# Patient Record
Sex: Male | Born: 1965 | Race: Black or African American | Hispanic: No | Marital: Single | Smoking: Never smoker
Health system: Southern US, Community
[De-identification: ages and names within clinical notes are randomized; demographics above are authoritative.]

## PROBLEM LIST (undated history)

## (undated) DIAGNOSIS — N529 Male erectile dysfunction, unspecified: Secondary | ICD-10-CM

## (undated) DIAGNOSIS — A6000 Herpesviral infection of urogenital system, unspecified: Secondary | ICD-10-CM

## (undated) DIAGNOSIS — E785 Hyperlipidemia, unspecified: Secondary | ICD-10-CM

## (undated) DIAGNOSIS — T7840XA Allergy, unspecified, initial encounter: Secondary | ICD-10-CM

## (undated) DIAGNOSIS — K219 Gastro-esophageal reflux disease without esophagitis: Secondary | ICD-10-CM

## (undated) DIAGNOSIS — G4730 Sleep apnea, unspecified: Secondary | ICD-10-CM

## (undated) DIAGNOSIS — R0789 Other chest pain: Secondary | ICD-10-CM

## (undated) DIAGNOSIS — K760 Fatty (change of) liver, not elsewhere classified: Secondary | ICD-10-CM

## (undated) HISTORY — PX: SMALL INTESTINE SURGERY: SHX150

## (undated) HISTORY — DX: Sleep apnea, unspecified: G47.30

## (undated) HISTORY — DX: Hyperlipidemia, unspecified: E78.5

## (undated) HISTORY — DX: Male erectile dysfunction, unspecified: N52.9

## (undated) HISTORY — DX: Herpesviral infection of urogenital system, unspecified: A60.00

## (undated) HISTORY — DX: Gastro-esophageal reflux disease without esophagitis: K21.9

## (undated) HISTORY — DX: Other chest pain: R07.89

## (undated) HISTORY — DX: Allergy, unspecified, initial encounter: T78.40XA

## (undated) HISTORY — DX: Fatty (change of) liver, not elsewhere classified: K76.0

---

## 1998-04-26 ENCOUNTER — Ambulatory Visit (HOSPITAL_COMMUNITY): Admission: RE | Admit: 1998-04-26 | Discharge: 1998-04-26 | Payer: Self-pay | Admitting: Pulmonary Disease

## 2008-02-24 ENCOUNTER — Ambulatory Visit: Payer: Self-pay | Admitting: Internal Medicine

## 2008-02-24 DIAGNOSIS — F528 Other sexual dysfunction not due to a substance or known physiological condition: Secondary | ICD-10-CM

## 2008-02-24 DIAGNOSIS — E785 Hyperlipidemia, unspecified: Secondary | ICD-10-CM | POA: Insufficient documentation

## 2008-02-24 DIAGNOSIS — E669 Obesity, unspecified: Secondary | ICD-10-CM

## 2008-02-24 HISTORY — DX: Other sexual dysfunction not due to a substance or known physiological condition: F52.8

## 2008-02-24 HISTORY — DX: Obesity, unspecified: E66.9

## 2008-02-24 HISTORY — DX: Hyperlipidemia, unspecified: E78.5

## 2008-02-24 LAB — CONVERTED CEMR LAB
ALT: 41 units/L (ref 0–53)
Albumin: 4.7 g/dL (ref 3.5–5.2)
BUN: 15 mg/dL (ref 6–23)
Basophils Absolute: 0 10*3/uL (ref 0.0–0.1)
Basophils Relative: 0.5 % (ref 0.0–3.0)
CO2: 30 meq/L (ref 19–32)
Calcium: 10.1 mg/dL (ref 8.4–10.5)
Creatinine, Ser: 1.2 mg/dL (ref 0.4–1.5)
Direct LDL: 146.5 mg/dL
Eosinophils Absolute: 0.1 10*3/uL (ref 0.0–0.7)
Eosinophils Relative: 1.5 % (ref 0.0–5.0)
Hemoglobin: 16.1 g/dL (ref 13.0–17.0)
Hgb A1c MFr Bld: 5.4 % (ref 4.6–6.0)
MCHC: 35.2 g/dL (ref 30.0–36.0)
MCV: 90.3 fL (ref 78.0–100.0)
Neutro Abs: 4.6 10*3/uL (ref 1.4–7.7)
RBC: 5.06 M/uL (ref 4.22–5.81)
TSH: 1.01 microintl units/mL (ref 0.35–5.50)
Testosterone-% Free: 2.5 % (ref 1.6–2.9)
Testosterone: 221.47 ng/dL — ABNORMAL LOW (ref 350–890)
Total Bilirubin: 1 mg/dL (ref 0.3–1.2)
WBC: 7.1 10*3/uL (ref 4.5–10.5)

## 2008-02-25 ENCOUNTER — Telehealth: Payer: Self-pay | Admitting: Internal Medicine

## 2008-02-25 DIAGNOSIS — E291 Testicular hypofunction: Secondary | ICD-10-CM

## 2008-02-25 HISTORY — DX: Testicular hypofunction: E29.1

## 2008-03-04 ENCOUNTER — Ambulatory Visit: Payer: Self-pay | Admitting: Endocrinology

## 2008-03-04 LAB — CONVERTED CEMR LAB
FSH: 5.2 milliintl units/mL
LH: 2.4 milliintl units/mL
Prolactin: 14 ng/mL
Saturation Ratios: 15.2 % — ABNORMAL LOW (ref 20.0–50.0)
Transferrin: 253.2 mg/dL (ref 212.0–360.0)

## 2008-03-23 ENCOUNTER — Ambulatory Visit: Payer: Self-pay | Admitting: Internal Medicine

## 2008-03-23 DIAGNOSIS — E781 Pure hyperglyceridemia: Secondary | ICD-10-CM | POA: Insufficient documentation

## 2008-03-23 DIAGNOSIS — J309 Allergic rhinitis, unspecified: Secondary | ICD-10-CM

## 2008-03-23 HISTORY — DX: Allergic rhinitis, unspecified: J30.9

## 2008-04-20 ENCOUNTER — Ambulatory Visit: Payer: Self-pay | Admitting: Internal Medicine

## 2008-10-19 ENCOUNTER — Ambulatory Visit: Payer: Self-pay | Admitting: Internal Medicine

## 2009-03-24 ENCOUNTER — Encounter: Payer: Self-pay | Admitting: Internal Medicine

## 2009-03-24 LAB — CONVERTED CEMR LAB
ALT: 33 units/L (ref 0–53)
AST: 30 units/L (ref 0–37)
BUN: 12 mg/dL (ref 6–23)
Basophils Absolute: 0 10*3/uL (ref 0.0–0.1)
Basophils Relative: 1 % (ref 0–1)
Bilirubin, Direct: 0.1 mg/dL (ref 0.0–0.3)
CO2: 27 meq/L (ref 19–32)
Calcium: 9.8 mg/dL (ref 8.4–10.5)
Cholesterol: 218 mg/dL — ABNORMAL HIGH (ref 0–200)
Eosinophils Absolute: 0.4 10*3/uL (ref 0.0–0.7)
Eosinophils Relative: 6 % — ABNORMAL HIGH (ref 0–5)
Glucose, Bld: 90 mg/dL (ref 70–99)
HCT: 42.6 % (ref 39.0–52.0)
Hemoglobin: 14.6 g/dL (ref 13.0–17.0)
Indirect Bilirubin: 0.4 mg/dL (ref 0.0–0.9)
Leukocytes, UA: NEGATIVE
MCHC: 34.3 g/dL (ref 30.0–36.0)
MCV: 89.9 fL (ref 78.0–100.0)
Monocytes Absolute: 0.5 10*3/uL (ref 0.1–1.0)
Monocytes Relative: 8 % (ref 3–12)
Nitrite: NEGATIVE
RBC: 4.74 M/uL (ref 4.22–5.81)
RDW: 13.2 % (ref 11.5–15.5)
Sodium: 141 meq/L (ref 135–145)
Specific Gravity, Urine: 1.022 (ref 1.005–1.030)
TSH: 0.823 microintl units/mL (ref 0.350–4.500)
Total Bilirubin: 0.5 mg/dL (ref 0.3–1.2)
Total CHOL/HDL Ratio: 5.9
Urine Glucose: NEGATIVE mg/dL
pH: 6 (ref 5.0–8.0)

## 2009-04-01 ENCOUNTER — Ambulatory Visit: Payer: Self-pay | Admitting: Internal Medicine

## 2009-04-01 DIAGNOSIS — B079 Viral wart, unspecified: Secondary | ICD-10-CM

## 2009-04-01 HISTORY — DX: Viral wart, unspecified: B07.9

## 2009-10-07 ENCOUNTER — Ambulatory Visit: Payer: Self-pay | Admitting: Internal Medicine

## 2009-10-07 DIAGNOSIS — R1319 Other dysphagia: Secondary | ICD-10-CM

## 2009-10-07 HISTORY — DX: Other dysphagia: R13.19

## 2009-10-14 ENCOUNTER — Encounter: Admission: RE | Admit: 2009-10-14 | Discharge: 2009-10-14 | Payer: Self-pay | Admitting: Internal Medicine

## 2009-10-14 ENCOUNTER — Telehealth: Payer: Self-pay | Admitting: Internal Medicine

## 2009-10-14 ENCOUNTER — Encounter (INDEPENDENT_AMBULATORY_CARE_PROVIDER_SITE_OTHER): Payer: Self-pay | Admitting: *Deleted

## 2009-10-14 DIAGNOSIS — K222 Esophageal obstruction: Secondary | ICD-10-CM

## 2009-10-14 HISTORY — DX: Esophageal obstruction: K22.2

## 2009-10-17 ENCOUNTER — Encounter (INDEPENDENT_AMBULATORY_CARE_PROVIDER_SITE_OTHER): Payer: Self-pay | Admitting: *Deleted

## 2009-11-28 ENCOUNTER — Ambulatory Visit: Payer: Self-pay | Admitting: Gastroenterology

## 2009-11-28 ENCOUNTER — Encounter (INDEPENDENT_AMBULATORY_CARE_PROVIDER_SITE_OTHER): Payer: Self-pay | Admitting: *Deleted

## 2010-01-05 ENCOUNTER — Encounter: Payer: Self-pay | Admitting: Gastroenterology

## 2010-01-05 ENCOUNTER — Ambulatory Visit (HOSPITAL_COMMUNITY)
Admission: RE | Admit: 2010-01-05 | Discharge: 2010-01-05 | Payer: Self-pay | Source: Home / Self Care | Attending: Gastroenterology | Admitting: Gastroenterology

## 2010-01-10 ENCOUNTER — Telehealth: Payer: Self-pay | Admitting: Gastroenterology

## 2010-01-12 ENCOUNTER — Encounter: Payer: Self-pay | Admitting: Gastroenterology

## 2010-01-31 NOTE — Letter (Signed)
Summary: EGD Instructions  [name]  [address] [place], [place] [address]   Phone: [phone]  Fax: [phone]       Adrian Schmidt    1965/03/02    MRN: [id number]       Procedure Day /Date: 01/05/10 THURS     Arrival Time: 7 am     Procedure Time:8 am     Location of Procedure:                     [facility] ( Outpatient Registration)    PREPARATION FOR ENDOSCOPY   On 01/05/10  THE DAY OF THE PROCEDURE:  1.   No solid foods, milk or milk products are allowed after midnight the night before your procedure.  2.   Do not drink anything colored red or purple.  Avoid juices with pulp.  No orange juice.  3.  You may drink clear liquids until 4 am, which is 4 hours before your procedure.                                                                                                CLEAR LIQUIDS INCLUDE: Water Jello Ice Popsicles Tea (sugar ok, no milk/cream) Powdered fruit flavored drinks Coffee (sugar ok, no milk/cream) Gatorade Juice: apple, white grape, white cranberry  Lemonade Clear bullion, consomm, broth Carbonated beverages (any kind) Strained chicken noodle soup Hard Candy   MEDICATION INSTRUCTIONS  Unless otherwise instructed, you should take regular prescription medications with a small sip of water as early as possible the morning of your procedure.           OTHER INSTRUCTIONS  You will need a responsible adult at least 43 years of age to accompany you and drive you home.   This person must remain in the waiting room during your procedure.  Wear loose fitting clothing that is easily removed.  Leave jewelry and other valuables at home.  However, you may wish to bring a book to read or an iPod/MP3 player to listen to music as you wait for your procedure to start.  Remove all body piercing jewelry and leave at home.  Total time from sign-in until discharge is approximately 2-3 hours.  You should go home directly  after your procedure and rest.  You can resume normal activities the day after your procedure.  The day of your procedure you should not:   Drive   Make legal decisions   Operate machinery   Drink alcohol   Return to work  You will receive specific instructions about eating, activities and medications before you leave.    The above instructions have been reviewed and explained to me by   _______________________    I fully understand and can verbalize these instructions _____________________________ Date _________

## 2010-01-31 NOTE — Assessment & Plan Note (Signed)
Summary: 6 mo. f/u - jr   Vital Signs:  Patient profile:   45 year old male Height:      72 inches Weight:      238.25 pounds BMI:     32.43 O2 Sat:      98 % on Room air Temp:     98.1 degrees F oral Pulse rate:   65 / minute Pulse rhythm:   regular Resp:     18 per minute BP sitting:   104 / 72  (right arm) Cuff size:   large  Vitals Entered By: Glendell Docker CMA (October 07, 2009 2:43 PM)  O2 Flow:  Room air CC: 6 month follow up  Is Patient Diabetic? No Pain Assessment Patient in pain? no      Comments paient states that he wakes up in the middle of the night feeling like he wants to vomit, he is concerned with chronic snoring   Primary Care Provider:  Dondra Spry DO  CC:  6 month follow up .  History of Present Illness: 45 y/o AA male c/o intermittent nocturnal regurgitation 3 or 4 times over last 6 months he wakes up with sensation to vomit seems like symptoms assoc with eating late at night  no day time symptoms of nausea or vomiting  occ feels food gets transiently stuck (10 secs) also occurs with solids ,  never with liquids  no change in bowel habits  occ mild headaches - once per month  he requests referral for elective vasectomy  Preventive Screening-Counseling & Management  Alcohol-Tobacco     Smoking Status: never  Allergies (verified): No Known Drug Allergies  Past History:  Past Medical History: Hyperlipidemia Hx of genital herpes  Hx of atypical chest pain - neg stress and 2 D Echo 2006 Erectile Dysfunction x 3-4 yrs  Low testosterone - normal iron studies,  normal LH, FSH, estradiol  Physical Exam  General:  alert, well-developed, and well-nourished.   Head:  normocephalic and atraumatic.   Eyes:  pupils equal, pupils round, and pupils reactive to light.  no peripheral vision defect Mouth:  pharynx pink and moist.  no oral lesions Neck:  supple, no masses, and no neck tenderness.   Lungs:  normal respiratory effort,  normal breath sounds, no crackles, and no wheezes.   Heart:  normal rate, regular rhythm, no murmur, and no gallop.   Abdomen:  soft, non-tender, normal bowel sounds, no masses, no hepatomegaly, and no splenomegaly.     Impression & Recommendations:  Problem # 1:  OTHER DYSPHAGIA (ICD-787.29) intermittent regurgitation and dysphagia to solids arrange barium swallow no chronic GERD symptoms anti reflux measure handout provided  Orders: Radiology Referral (Radiology)  Problem # 2:  VASECTOMY STERILIZATION STATUS (ICD-V26.52) pt requests referral to vasectomy  Orders: Urology Referral (Urology)  Problem # 3:  HYPERTRIGLYCERIDEMIA (ICD-272.1) pt has not been taking pravastatin.  low sat fat diet handout provided.  repeat FLP in 6 months  The following medications were removed from the medication list:    Pravastatin Sodium 40 Mg Tabs (Pravastatin sodium) ..... One by mouth at bedtime  Labs Reviewed: SGOT: 30 (03/24/2009)   SGPT: 33 (03/24/2009)   HDL:37 (03/24/2009), 41.6 (02/24/2008)  LDL:149 (03/24/2009), DEL (02/24/2008)  Chol:218 (03/24/2009), 259 (02/24/2008)  Trig:159 (03/24/2009), 314 (02/24/2008)  Patient Instructions: 1)  Please schedule a follow-up appointment in 6 months. 2)  Lipid Panel prior to visit, ICD-9:  272.4 3)  High sensitivity CRP :  272.4  4)  Please return for lab work one (1) week before your next appointment.  5)  Our office will contact you re:  barium swallow test and urology referral   Immunization History:  Influenza Immunization History:    Influenza:  declined (10/07/2009)   Contraindications/Deferment of Procedures/Staging:    Test/Procedure: FLU VAX    Reason for deferment: patient declined   Current Allergies (reviewed today): No known allergies

## 2010-01-31 NOTE — Assessment & Plan Note (Signed)
History of Present Illness Visit Type: Initial Consult Primary GI MD: Rob Bunting MD Primary Linken Mcglothen: Dondra Spry DO Requesting Sahaj Bona: Dondra Spry, DO Chief Complaint: schatzki's ring History of Present Illness:     very pleasant 45 year old man.  he has occasional dysphagia to solid foods.  No choking.  Is very uncomfortable.   This has been going on for 2 years.  Can awaken with nausea.  He gets rare pyrosis.  Overall stable.  The dysphgia is solid only, about 2 times a week. Usually will go down with time, standing up.  he had a barium esophagram last month and this showed a small hiatal herniaAs well as a narrowing at his GE junction that looked like a Schatzki's ring.           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  All other review of systems was otherwise negative.   Vital Signs:  Patient profile:   45 year old male Height:      74 inches Weight:      236.50 pounds BMI:     30.47 Pulse rate:   68 / minute Pulse rhythm:   regular BP sitting:   110 / 72  (left arm) Cuff size:   large  Vitals Entered By: Adrian Schmidt CMA Adrian Schmidt) (November 28, 2009 10:19 AM)  Physical Exam  Additional Exam:  Constitutional:  generally well appearing Psychiatric: alert and oriented times 3 Eyes: extraocular movements intact Mouth: oropharynx moist, no lesions Neck: supple, no lymphadenopathy Cardiovascular: heart regular rate and rythm Lungs: CTA bilaterally Abdomen: soft, non-tender, non-distended, no obvious ascites, no peritoneal signs, normal bowel sounds Extremities: no lower extremity edema bilaterally Skin: no lesions on visible extremities    Impression & Recommendations:  Problem # 1:  Nonprogressive, intermittent, solid food dysphagia; Schatzki's ring on barium esophagram we will proceed with EGD and dilation at his soonest convenience. He wants to wait until after the holidays since his work schedule is quite hectic right now. That seems very reasonable to me. He has nausea at night after a big meal sometimes and I recommended he try H2 blocker at bedtime on the occasions when he eats a large meal and lays down for bed soon thereafter.  Patient Instructions: 1)  You will be scheduled to have an upper endoscopy. 2)  Try zantac or pepcid at bedtime on the nights you have had a late meal. 3)  A copy of this information will be sent to Dr. Artist Pais. 4)  Chew food very well, eat slowly. 5)  The medication list was reviewed and reconciled.  All changed / newly prescribed medications were explained.  A  complete medication list was provided to the patient / caregiver.  Appended Document: Orders Update/EGD    Clinical Lists Changes  Orders: Added new Test order of ZEGD Balloon Dil (ZEGD Balloon) - Signed

## 2010-01-31 NOTE — Procedures (Signed)
Summary: Preparation for Endoscopy / [facility]  Preparation for Endoscopy / [facility]   Imported By: Lennie Odor 11/30/2009 14:56:55  _____________________________________________________________________  External Attachment:    Type:   Image     Comment:   External Document

## 2010-01-31 NOTE — Assessment & Plan Note (Signed)
Summary: cpx/mhf, cpx- jr   Vital Signs:  Patient profile:   45 year old Schmidt Height:      72 inches Weight:      251.75 pounds BMI:     34.27 O2 Sat:      98 % on Room air Temp:     98.3 degrees F oral Pulse rate:   67 / minute Pulse rhythm:   regular Resp:     18 per minute BP sitting:   102 / 72  (right arm) Cuff size:   large  Vitals Entered By: Glendell Docker CMA (April 01, 2009 2:42 PM)  O2 Flow:  Room air CC: Rm2- CPX   Primary Care Provider:  Dondra Spry DO  CC:  Rm2- CPX.  History of Present Illness: Adrian Schmidt for yearly cpx no significant interval hx.  reviewed w/u for hypogonadism with Dr. Everardo All ED is better.  good reponse to cialis in the past  reviewed lipid panel , fasting labs  poor diet.  liberal intake of fatty foods    Preventive Screening-Counseling & Management  Alcohol-Tobacco     Alcohol drinks/day: <1     Smoking Status: never  Caffeine-Diet-Exercise     Caffeine use/day: 1-2 weekly     Does Patient Exercise: yes     Times/week: 4  Allergies (verified): No Known Drug Allergies  Past History:  Past Medical History: Hyperlipidemia Hx of genital herpes Hx of atypical chest pain - neg stress and 2 D Echo 2006 Erectile Dysfunction x 3-4 yrs  Low testosterone - normal iron studies,  normal LH, FSH, estradiol  Family History: Diabetes - MGM, aunt Prostate ca - MGF (died age 35) Colon ca - MGF CAD - MGM  neg for low testosterone     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  The patient denies chest pain, syncope, dyspnea on exertion, peripheral edema, prolonged cough, abdominal pain, melena, hematochezia, and severe indigestion/heartburn.         hx of loud snoring he notes skin lesion base of  penis  Physical Exam  General:  alert, well-developed, and well-nourished.   Head:  normocephalic and atraumatic.   Ears:  R ear normal and L ear normal.   Mouth:  Oral mucosa and oropharynx without lesions or exudates.  Teeth in good repair. Neck:  No deformities, masses, or tenderness noted. Lungs:  normal respiratory effort, normal breath sounds, no crackles, and no wheezes.   Heart:  normal rate, regular rhythm, no murmur, and no gallop.   Abdomen:  soft, non-tender, and no masses.   Genitalia:  circumcised.  small 4-5 mm verrucous lesion base of penis Extremities:  No lower extremity edema Neurologic:  cranial nerves II-XII intact and gait normal.   Psych:  memory intact for recent and remote, normally interactive, good eye contact, and not anxious appearing.     Impression & Recommendations:  Problem # 1:  HEALTH MAINTENANCE EXAM (ICD-V70.0) Reviewed adult health maintenance protocols. pt counceled on diet and exercise.  hx of loud snoring.  we discussed possibility of OSA.   focus on wt loss for now.  Td Booster: Tdap (02/24/2008)   Flu Vax: Declined (02/24/2008)   Chol: 218 (03/24/2009)   HDL: 37 (03/24/2009)  LDL: 149 (03/24/2009)   TG: 159 (03/24/2009) TSH: 0.823 (03/24/2009)   HgbA1C: 5.4 (02/24/2008)     Problem # 2:  HYPERTRIGLYCERIDEMIA (ICD-272.1) start statin.  pt encouraged to use fish oil supplement  His updated medication list for this problem includes:    Pravastatin Sodium 40 Mg Tabs (Pravastatin sodium) ..... One by mouth at bedtime  Labs Reviewed: SGOT: 30 (03/24/2009)   SGPT: 33 (03/24/2009)   HDL:37 (03/24/2009), 41.6 (02/24/2008)  LDL:149 (03/24/2009), DEL (02/24/2008)  Chol:218 (03/24/2009), 259 (02/24/2008)  Trig:159 (03/24/2009), 314 (02/24/2008)  Problem # 3:  HYPOGONADISM (ICD-257.2) he declined testosterone tx when seen by Dr. Everardo All.  Continue cialis as needed.  samples provided.  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  tx ed with liquid nitrogen.  he may need second application  Orders: Cryotherapy/Destruction benign or premalignant lesion (1st lesion)  ([address])  Complete Medication List: 1)  Zyrtec Allergy 10 Mg Tabs (Cetirizine hcl) .... Take 1 tablet by mouth once a day as needed 2)  Pravastatin Sodium 40 Mg Tabs (Pravastatin sodium) .... One by mouth at bedtime 3)  Cialis 20 Mg Tabs (Tadalafil) .... 1/2 by mouth once daily as needed  Patient Instructions: 1)  Please schedule a follow-up appointment in 6 months. 2)  [email or web address] 3)  Hepatic Panel prior to visit, ICD-9: 272.4 4)  Lipid Panel prior to visit, ICD-9: 272.4 5)  Lab work within 2 months Prescriptions: PRAVASTATIN SODIUM 40 MG TABS (PRAVASTATIN SODIUM) one by mouth at bedtime  #90 x 1   Entered and Authorized by:   D. Thomos Lemons DO   Signed by:   D. Thomos Lemons DO on 04/01/2009   Method used:   Electronically to        [facility] [phone]* (retail)       [address]       [place], [place]  [address]       Ph: [phone] or [phone]       Fax: [phone]   RxID:   [id number]   Current Allergies (reviewed today): No known allergies

## 2010-01-31 NOTE — Letter (Signed)
Summary: New Patient letter  [facility]  [address] [place], [place] [address]   Phone: [phone]  Fax: [phone]       10/17/2009 MRN: [id number]  [facility] [address] [place], [place]  [address]  Dear Mr. Pothier,  Welcome to the Gastroenterology Division at [facility].    You are scheduled to see Dr.  Christella Hartigan on 11-28-09 at 10:00a.m. on the 3rd floor at [facility], [address]  We ask that you try to arrive at our office 15 minutes prior to your appointment time to allow for check-in.  We would like you to complete the enclosed self-administered evaluation form prior to your visit and bring it with you on the day of your appointment.  We will review it with you.  Also, please bring a complete list of all your medications or, if you prefer, bring the medication bottles and we will list them.  Please bring your insurance card so that we may make a copy of it.  If your insurance requires a referral to see a specialist, please bring your referral form from your primary care physician.  Co-payments are due at the time of your visit and may be paid by cash, check or credit card.     Your office visit will consist of a consult with your physician (includes a physical exam), any laboratory testing he/she may order, scheduling of any necessary diagnostic testing (e.g. x-ray, ultrasound, CT-scan), and scheduling of a procedure (e.g. Endoscopy, Colonoscopy) if required.  Please allow enough time on your schedule to allow for any/all of these possibilities.    If you cannot keep your appointment, please call [phone] to cancel or reschedule prior to your appointment date.  This allows [place] the opportunity to schedule an appointment for another patient in need of care.  If you do not cancel or reschedule by 5 p.m. the business day prior to your appointment date, you will be charged a $50.00 late cancellation/no-show fee.    Thank you for choosing  [facility] for your medical needs.  We appreciate the opportunity to care for you.  Please visit [place] at our website  to learn more about our practice.                     Sincerely,                                                             The Gastroenterology Division

## 2010-01-31 NOTE — Progress Notes (Signed)
  Phone Note Outgoing Call   Summary of Call: informed pt about esophogram results.  I rec referral to GI  Initial call taken by: D. Thomos Lemons DO,  October 14, 2009 3:45 PM  Follow-up for Phone Call        Appt with [facility]    Dr Christella Hartigan  ,November 28th  Pt informed   Follow-up by: Darral Dash,  October 17, 2009 8:38 AM  New Problems: SCHATZKI'S RING (ICD-530.3)   New Problems: SCHATZKI'S RING (ICD-530.3)

## 2010-01-31 NOTE — Miscellaneous (Signed)
Summary: Orders Update  Clinical Lists Changes  Orders: Added new Test order of TLB-CBC Platelet - w/Differential (85025-CBCD) - Signed Added new Test order of TLB-Lipid Panel (80061-LIPID) - Signed Added new Test order of TLB-Hepatic/Liver Function Pnl (80076-HEPATIC) - Signed Added new Test order of TLB-BMP (Basic Metabolic Panel-BMET) (80048-METABOL) - Signed Added new Test order of TLB-TSH (Thyroid Stimulating Hormone) (84443-TSH) - Signed Added new Test order of T-Urinalysis (29528-41324) - Signed

## 2010-02-02 ENCOUNTER — Encounter: Payer: Self-pay | Admitting: Internal Medicine

## 2010-02-02 NOTE — Letter (Signed)
Summary: Results Letter  [facility]  [address] [place], [place] [address]   Phone: [phone]  Fax: [phone]        January 12, 2010 MRN: [id number]    [facility] [address] [place], [place]  [address]    Dear [name],   The biopsies taken during your recent upper endoscopy showed no sign of cancer. Your esophagus is irritated by acid reflux and so you should continue to follow the recommendations that we discussed at the time of your procedure (stay on anti-acid medicine every day)  Please feel free to call if you have any further questions or concerns.       Sincerely,  Rachael Fee MD  This letter has been electronically signed by your physician.  Appended Document: Results Letter letter mailed

## 2010-02-02 NOTE — Progress Notes (Signed)
Summary: needs doctors note  Phone Note Call from Patient Call back at Home Phone [phone]   Caller: Patient Call For: Dr Christella Hartigan Reason for Call: Talk to Nurse Summary of Call: Patient needs a doctors note faxed over to his employer for procedure done to [phone] Att: Manuela Schwartz Initial call taken by: Tawni Levy,  January 10, 2010 2:38 PM  Follow-up for Phone Call        Dr Christella Hartigan pt had EGD 01/05/10 and wants a work note for 01/05/10, 01/06/10 and 01/07/10.  He says you ok'd this at the procedure.  Can I fax it for those days? Follow-up by: Chales Abrahams CMA Duncan Dull),  January 10, 2010 2:44 PM  Additional Follow-up for Phone Call Additional follow up Details #1::        i don't remember telling him he could be off for 2 days after an EGD.  work note for the day of the procedure only, please Additional Follow-up by: Rachael Fee MD,  January 10, 2010 3:34 PM    Additional Follow-up for Phone Call Additional follow up Details #2::    left message on machine to call back Chales Abrahams CMA Duncan Dull)  January 10, 2010 3:44 PM   left message on machine to call back Chales Abrahams CMA Duncan Dull)  January 11, 2010 8:32 AM   pt returned call and he was notified that he could have a work note for the day of the procedure ONLY.  He did not want the note left at the front desk and ask me not to fax the note at this time he states he will call when he is ready for the fax. Follow-up by: Chales Abrahams CMA Duncan Dull),  January 11, 2010 8:54 AM

## 2010-02-02 NOTE — Procedures (Signed)
Summary: Upper Endoscopy  Patient: [name] Note: All result statuses are Final unless otherwise noted.  Tests: (1) Upper Endoscopy (EGD)   EGD Upper Endoscopy       DONE     [facility]     [address] [place], [place]  [address]           ENDOSCOPY PROCEDURE REPORT           PATIENT:  Adrian Schmidt, Adrian Schmidt  MR#:  [id number]     BIRTHDATE:  December 28, 1965, 44 yrs. old  GENDER:  male     ENDOSCOPIST:  Rachael Fee, MD     Referred by:  Thomos Lemons, DO     PROCEDURE DATE:  01/05/2010     PROCEDURE:  EGD with balloon dilatation, EGD with biopsy, [address]     ASA CLASS:  Class II     INDICATIONS:  dysphagia, ring noted on recent UGI     MEDICATIONS:  Fentanyl 50 mcg IV, Versed 5 mg IV     TOPICAL ANESTHETIC:  none           DESCRIPTION OF PROCEDURE:   After the risks benefits and     alternatives of the procedure were thoroughly explained, informed     consent was obtained.  The EG-[id number] ([id number]) endoscope was     introduced through the mouth and advanced to the second portion of     the duodenum, without limitations.  The instrument was slowly     withdrawn as the mucosa was fully examined.     <<PROCEDUREIMAGES>>     There was a spoke of reflux esophagitis (1-2cm long) at GE     junction. There was a Schatzki's type ring at GE junction with     abnormal mucosa along the rim of the ring that was probably     inflammation (reflux) related but biopsies were taken to check for     neoplasm. The ring was then dilated up to 19mm with CRE TTS     balloon (see image002 and image008).  Otherwise the examination     was normal (see image006, image005, image004, image003, and     image007).    Retroflexed views revealed no abnormalities.    The     scope was then withdrawn from the patient and the procedure     completed.           COMPLICATIONS:  None           ENDOSCOPIC IMPRESSION:     1) Reflux esophagitis.  Schatzki's ring dilated.     2) Abnormal mucosa along ring  that is likely inflammation (reflux)     related. Biopsied to check for neoplasm.     2) Otherwise normal examination           RECOMMENDATIONS:     He will start once daily OTC prilosec given the esophagitis.     Await pathology results.     He knows to call if swallowing trouble returns.           ______________________________     Rachael Fee, MD           n.     eSIGNED:   Rachael Fee at 01/05/2010 09:27 AM           Dicky Doe, [id number]  Note: An exclamation mark (!) indicates a result that was not dispersed into the flowsheet. Document Creation  Date: 01/05/2010 9:28 AM _______________________________________________________________________  (1) Order result status: Final Collection or observation date-time: 01/05/2010 09:13 Requested date-time:  Receipt date-time:  Reported date-time:  Referring Physician:   Ordering Physician: Rob Bunting [phone]) Specimen Source:  Source: Launa Grill Order Number: [id number] Lab site:

## 2010-02-14 ENCOUNTER — Encounter: Payer: Self-pay | Admitting: Internal Medicine

## 2010-02-14 ENCOUNTER — Telehealth: Payer: Self-pay | Admitting: Internal Medicine

## 2010-02-22 NOTE — Progress Notes (Signed)
Summary: Levitra Rx  Phone Note Call from Patient Call back at Home Phone [phone]   Caller: Patient Call For: nurse Reason for Call: Talk to Nurse Summary of Call: pt called and explained that dr. Artist Pais gave him a sample of levitra?. pt wants a rx for it. [facility] [address] please assist. Initial call taken by: Elba Barman,  February 14, 2010 9:04 AM  Follow-up for Phone Call        call returned to patient at [phone], no answer. A detailed voice message was left informing patient rx sent to pharmacy. Follow-up by: Glendell Docker CMA,  February 14, 2010 10:52 AM    New/Updated Medications: LEVITRA 10 MG TABS (VARDENAFIL HCL) take one tab daily as needed Prescriptions: LEVITRA 10 MG TABS (VARDENAFIL HCL) take one tab daily as needed  #10 x 5   Entered and Authorized by:   D. Thomos Lemons DO   Signed by:   D. Thomos Lemons DO on 02/14/2010   Method used:   [facility] to        [facility]  [address] [id number]       [address]       [place], [place]  [address]       Ph: [phone] or [phone]       Fax: [phone]   RxID:   [id number]

## 2010-02-28 ENCOUNTER — Encounter: Payer: Self-pay | Admitting: Internal Medicine

## 2010-02-28 NOTE — Medication Information (Signed)
Summary: Prior Authorization Request for Levitra  Prior Authorization Request for Levitra   Imported By: Maryln Gottron 02/23/2010 12:37:06  _____________________________________________________________________  External Attachment:    Type:   Image     Comment:   External Document

## 2010-03-14 NOTE — Medication Information (Signed)
Summary: Denial for Levitra/[facility]  Denial for Levitra/[facility]   Imported By: Lanelle Bal 03/09/2010 09:15:02  _____________________________________________________________________  External Attachment:    Type:   Image     Comment:   External Document

## 2010-04-14 ENCOUNTER — Ambulatory Visit: Payer: Self-pay | Admitting: Internal Medicine

## 2010-04-18 ENCOUNTER — Other Ambulatory Visit: Payer: Self-pay | Admitting: Internal Medicine

## 2010-04-18 DIAGNOSIS — E785 Hyperlipidemia, unspecified: Secondary | ICD-10-CM

## 2010-04-18 LAB — LIPID PANEL
HDL: 41 mg/dL (ref >39)
Total CHOL/HDL Ratio: 5.1 Ratio
Triglycerides: 174 mg/dL — ABNORMAL HIGH (ref <150)

## 2010-04-24 ENCOUNTER — Encounter: Payer: Self-pay | Admitting: Internal Medicine

## 2010-04-24 ENCOUNTER — Ambulatory Visit (INDEPENDENT_AMBULATORY_CARE_PROVIDER_SITE_OTHER): Payer: 59 | Admitting: Internal Medicine

## 2010-04-24 VITALS — BP 100/70 | HR 79 | Temp 98.5°F | Resp 18 | Wt 243.0 lb

## 2010-04-24 DIAGNOSIS — F528 Other sexual dysfunction not due to a substance or known physiological condition: Secondary | ICD-10-CM

## 2010-04-24 DIAGNOSIS — E785 Hyperlipidemia, unspecified: Secondary | ICD-10-CM

## 2010-04-24 MED ORDER — VARDENAFIL HCL 20 MG PO TABS: 20.0000 mg | ORAL_TABLET | ORAL | Status: DC | PRN

## 2010-04-24 NOTE — Assessment & Plan Note (Signed)
Mild improvement with dietary/lifestyle change. Patient defers use of statin for now. Repeat fasting lipid profile and C-reactive protein in one year Educational handout on low saturated fat diet provided.

## 2010-04-24 NOTE — Patient Instructions (Signed)
Please complete the following lab tests before your next follow up appointment: FLP, CRP  - 272.4

## 2010-04-24 NOTE — Assessment & Plan Note (Signed)
No change. Continue use of Levitra as directed

## 2010-04-24 NOTE — Progress Notes (Signed)
  Subjective:    Patient ID: Adrian Schmidt, male    DOB: 05-Mar-1965, 45 y.o.   MRN: [id number]  Hyperlipidemia This is a chronic problem. The current episode started more than 1 year ago. The problem is controlled. Exacerbating diseases include obesity. He has no history of hypothyroidism. Pertinent negatives include no chest pain. Current antihyperlipidemic treatment includes diet change. The current treatment provides mild improvement of lipids. Risk factors for coronary artery disease include male sex.   Recent fasting lipid profile reviewed. C-reactive protein was normal at 0.6  Erectile dysfunction-good response to Levitra. He denies adverse effects.  Interval history: Patient had esophageal dilatation for stricture. Dysphasia and regurgitation symptoms have resolved.  Review of Systems  Cardiovascular: Negative for chest pain.    Past Medical History  Diagnosis Date  . Hyperlipidemia   . Herpes genitalia   . Atypical chest pain     negative stress and 2 D echo 2006  . Erectile dysfunction   . Low testosterone     normal iron, LH, FSH and estradiol    History   Social History  . Marital Status: Single    Spouse Name: N/A    Number of Children: 2  . Years of Education: N/A   Occupational History  . [employer]ETOR [employer]   Social History Main Topics  . Smoking status: Never Smoker   . Smokeless tobacco: Not on file  . Alcohol Use: Yes     socially  . Drug Use: Not on file  . Sexually Active: Not on file   Other Topics Concern  . Not on file   Social History Narrative   Regular Exercise:  yes    No past surgical history on file.  Family History  Problem Relation Age of Onset  . Diabetes Maternal Grandmother   . Heart disease Maternal Grandmother     CAD  . Cancer Maternal Grandfather     prostate and colon    No Known Allergies  No current outpatient prescriptions on file prior to visit.    BP 100/70  Pulse 79  Temp(Src) 98.5 F (36.9 C)  (Oral)  Resp 18  Wt 243 lb (110.224 kg)  SpO2 97%       Objective:   Physical Exam  Constitutional: He is oriented to person, place, and time. He appears well-developed and well-nourished.  Neck:       No carotid bruit  Cardiovascular: Normal rate, regular rhythm and normal heart sounds.   Pulmonary/Chest: Effort normal and breath sounds normal. He has no wheezes. He has no rales.  Neurological: He is oriented to person, place, and time.  Psychiatric: He has a normal mood and affect. His behavior is normal.          Assessment & Plan:

## 2010-10-16 ENCOUNTER — Telehealth: Payer: Self-pay | Admitting: Internal Medicine

## 2010-10-16 NOTE — Telephone Encounter (Signed)
Patient has 1 year follow up with Dr. Artist Pais on 04/27/11. Please order labs one week prior to visit.

## 2010-10-16 NOTE — Telephone Encounter (Signed)
Schedule cpx labs 1 wk prior 

## 2011-04-13 ENCOUNTER — Encounter: Payer: Self-pay | Admitting: Family

## 2011-04-13 ENCOUNTER — Ambulatory Visit (INDEPENDENT_AMBULATORY_CARE_PROVIDER_SITE_OTHER): Payer: 59 | Admitting: Family

## 2011-04-13 VITALS — BP 110/74 | Temp 98.3°F | Wt 252.0 lb

## 2011-04-13 DIAGNOSIS — M545 Low back pain: Secondary | ICD-10-CM

## 2011-04-13 MED ORDER — DICLOFENAC SODIUM 75 MG PO TBEC: 75.0000 mg | DELAYED_RELEASE_TABLET | Freq: Two times a day (BID) | ORAL | Status: AC

## 2011-04-13 MED ORDER — CYCLOBENZAPRINE HCL 10 MG PO TABS: 10.0000 mg | ORAL_TABLET | Freq: Three times a day (TID) | ORAL | Status: AC | PRN

## 2011-04-13 MED ORDER — KETOROLAC TROMETHAMINE 60 MG/2ML IM SOLN
60.0000 mg | Freq: Once | INTRAMUSCULAR | Status: AC
  Administered 2011-04-13: 60 mg via INTRAMUSCULAR

## 2011-04-13 NOTE — Progress Notes (Signed)
Subjective:    Patient ID: Adrian Schmidt, male    DOB: 06/30/1965, 46 y.o.   MRN: [id number]  HPI Comments: 46 yo black male presents with c/o nonradiating, low back discomfort described as throbbing, sharp, 5/10. Pain gets worse with lying flat, bending, or turning side to side,  and gets better with sitting. Use ice and moist heat with no relief. Has history of same pain for years that has been relieve previously with OTC ibuprofen. Works two jobs that does not require lifting. Works out with [employer] and lifts up to 300lbs. Denies any recent injury, urinary s/s, rash, history kidney stones,  or past medical history trauma. Pain does go up to 10/10 with lying flat.   Back Pain Pertinent negatives include no fever.      Review of Systems  Constitutional: Positive for activity change. Negative for fever, chills, diaphoresis, appetite change and fatigue.  Respiratory: Negative.   Cardiovascular: Negative.   Genitourinary: Negative.   Musculoskeletal: Positive for back pain and arthralgias. Negative for joint swelling and gait problem.  Skin: Negative.    Past Medical History  Diagnosis Date  . Hyperlipidemia   . Herpes genitalia   . Atypical chest pain     negative stress and 2 D echo 2006  . Erectile dysfunction   . Low testosterone     normal iron, LH, FSH and estradiol    History   Social History  . Marital Status: Single    Spouse Name: N/A    Number of Children: 2  . Years of Education: N/A   Occupational History  . EXPIDETOR [employer]   Social History Main Topics  . Smoking status: Never Smoker   . Smokeless tobacco: Not on file  . Alcohol Use: Yes     socially  . Drug Use: Not on file  . Sexually Active: Not on file   Other Topics Concern  . Not on file   Social History Narrative   Regular Exercise:  yes    No past surgical history on file.  Family History  Problem Relation Age of Onset  . Diabetes Maternal Grandmother   . Heart disease  Maternal Grandmother     CAD  . Cancer Maternal Grandfather     prostate and colon    No Known Allergies  Current Outpatient Prescriptions on File Prior to Visit  Medication Sig Dispense Refill  . vardenafil (LEVITRA) 20 MG tablet Take 1 tablet (20 mg total) by mouth as needed for erectile dysfunction.  10 tablet  3    BP 110/74  Temp(Src) 98.3 F (36.8 C) (Oral)  Wt 252 lb (114.306 kg)chart    Objective:   Physical Exam  Constitutional: He is oriented to person, place, and time. He appears well-developed and well-nourished. No distress.  Neck: Normal range of motion. Neck supple. No tracheal deviation present. No thyromegaly present.  Cardiovascular: Normal rate, regular rhythm, normal heart sounds and intact distal pulses.  Exam reveals no gallop and no friction rub.   No murmur heard. Pulmonary/Chest: Effort normal and breath sounds normal. No respiratory distress. He has no wheezes. He has no rales. He exhibits no tenderness.  Musculoskeletal: Normal range of motion. He exhibits no edema and no tenderness.       No point tenderness, straight leg raise positive both legs, pain goes up to 10/10 with straight leg raise.  Neurological: He is alert and oriented to person, place, and time.  Skin: Skin is warm and  dry. He is not diaphoretic.          Assessment & Plan:  Assessment: Back pain Plan: Toradol, voltaren, flexeril. Teaching handouts provided on diagnosis and treatments, encouraged to RTC if s/s get worse.

## 2011-04-13 NOTE — Patient Instructions (Signed)
Back Pain, Adult Low back pain is very common. About 1 in 5 people have back pain.The cause of low back pain is rarely dangerous. The pain often gets better over time.About half of people with a sudden onset of back pain feel better in just 2 weeks. About 8 in 10 people feel better by 6 weeks.  CAUSES Some common causes of back pain include:  Strain of the muscles or ligaments supporting the spine.   Wear and tear (degeneration) of the spinal discs.   Arthritis.   Direct injury to the back.  DIAGNOSIS Most of the time, the direct cause of low back pain is not known.However, back pain can be treated effectively even when the exact cause of the pain is unknown.Answering your caregiver's questions about your overall health and symptoms is one of the most accurate ways to make sure the cause of your pain is not dangerous. If your caregiver needs more information, he or she may order lab work or imaging tests (X-rays or MRIs).However, even if imaging tests show changes in your back, this usually does not require surgery. HOME CARE INSTRUCTIONS For many people, back pain returns.Since low back pain is rarely dangerous, it is often a condition that people can learn to manageon their own.   Remain active. It is stressful on the back to sit or stand in one place. Do not sit, drive, or stand in one place for more than 30 minutes at a time. Take short walks on level surfaces as soon as pain allows.Try to increase the length of time you walk each day.   Do not stay in bed.Resting more than 1 or 2 days can delay your recovery.   Do not avoid exercise or work.Your body is made to move.It is not dangerous to be active, even though your back may hurt.Your back will likely heal faster if you return to being active before your pain is gone.   Pay attention to your body when you bend and lift. Many people have less discomfortwhen lifting if they bend their knees, keep the load close to their  bodies,and avoid twisting. Often, the most comfortable positions are those that put less stress on your recovering back.   Find a comfortable position to sleep. Use a firm mattress and lie on your side with your knees slightly bent. If you lie on your back, put a pillow under your knees.   Only take over-the-counter or prescription medicines as directed by your caregiver. Over-the-counter medicines to reduce pain and inflammation are often the most helpful.Your caregiver may prescribe muscle relaxant drugs.These medicines help dull your pain so you can more quickly return to your normal activities and healthy exercise.   Put ice on the injured area.   Put ice in a plastic bag.   Place a towel between your skin and the bag.   Leave the ice on for 15 to 20 minutes, 3 to 4 times a day for the first 2 to 3 days. After that, ice and heat may be alternated to reduce pain and spasms.   Ask your caregiver about trying back exercises and gentle massage. This may be of some benefit.   Avoid feeling anxious or stressed.Stress increases muscle tension and can worsen back pain.It is important to recognize when you are anxious or stressed and learn ways to manage it.Exercise is a great option.  SEEK MEDICAL CARE IF:  You have pain that is not relieved with rest or medicine.   You have   pain that does not improve in 1 week.   You have new symptoms.   You are generally not feeling well.  SEEK IMMEDIATE MEDICAL CARE IF:   You have pain that radiates from your back into your legs.   You develop new bowel or bladder control problems.   You have unusual weakness or numbness in your arms or legs.   You develop nausea or vomiting.   You develop abdominal pain.   You feel faint.  Document Released: 12/18/2004 Document Revised: 12/07/2010 Document Reviewed: 05/08/2010 Exit[employer] Patient Information 2012 [employer]. 

## 2011-04-16 ENCOUNTER — Other Ambulatory Visit (INDEPENDENT_AMBULATORY_CARE_PROVIDER_SITE_OTHER): Payer: 59

## 2011-04-16 DIAGNOSIS — Z0000 Encounter for general adult medical examination without abnormal findings: Secondary | ICD-10-CM

## 2011-04-16 LAB — BASIC METABOLIC PANEL
BUN: 18 mg/dL (ref 6–23)
CO2: 27 mEq/L (ref 19–32)
Calcium: 9.5 mg/dL (ref 8.4–10.5)
Chloride: 105 mEq/L (ref 96–112)
Creatinine, Ser: 1.2 mg/dL (ref 0.4–1.5)
Glucose, Bld: 74 mg/dL (ref 70–99)

## 2011-04-16 LAB — HEPATIC FUNCTION PANEL
Bilirubin, Direct: 0.1 mg/dL (ref 0.0–0.3)
Total Bilirubin: 0.6 mg/dL (ref 0.3–1.2)
Total Protein: 6.7 g/dL (ref 6.0–8.3)

## 2011-04-16 LAB — CBC WITH DIFFERENTIAL/PLATELET
Basophils Absolute: 0 10*3/uL (ref 0.0–0.1)
Basophils Relative: 0.6 % (ref 0.0–3.0)
Eosinophils Absolute: 0.2 10*3/uL (ref 0.0–0.7)
MCHC: 34 g/dL (ref 30.0–36.0)
MCV: 91.7 fl (ref 78.0–100.0)
Monocytes Absolute: 0.5 10*3/uL (ref 0.1–1.0)
Neutrophils Relative %: 62.1 % (ref 43.0–77.0)
Platelets: 192 10*3/uL (ref 150.0–400.0)
RDW: 14.2 % (ref 11.5–14.6)

## 2011-04-16 LAB — LIPID PANEL: HDL: 40.8 mg/dL (ref >39.00)

## 2011-04-16 LAB — PSA: PSA: 0.71 ng/mL (ref 0.10–4.00)

## 2011-04-20 ENCOUNTER — Ambulatory Visit: Payer: 59 | Admitting: Internal Medicine

## 2011-04-27 ENCOUNTER — Ambulatory Visit (INDEPENDENT_AMBULATORY_CARE_PROVIDER_SITE_OTHER): Payer: 59 | Admitting: Internal Medicine

## 2011-04-27 ENCOUNTER — Encounter: Payer: Self-pay | Admitting: Internal Medicine

## 2011-04-27 VITALS — BP 112/76 | HR 70 | Temp 98.5°F | Ht 73.0 in | Wt 250.0 lb

## 2011-04-27 DIAGNOSIS — Z0000 Encounter for general adult medical examination without abnormal findings: Secondary | ICD-10-CM

## 2011-04-27 HISTORY — DX: Encounter for general adult medical examination without abnormal findings: Z00.00

## 2011-04-27 LAB — POCT URINALYSIS DIPSTICK
Bilirubin, UA: NEGATIVE
Leukocytes, UA: NEGATIVE
Nitrite, UA: NEGATIVE
pH, UA: 7.5

## 2011-04-27 MED ORDER — FLUTICASONE PROPIONATE 50 MCG/ACT NA SUSP: 2.0000 | Freq: Every day | NASAL | Status: DC

## 2011-04-27 NOTE — Patient Instructions (Signed)
CPX labs before next office visit

## 2011-04-27 NOTE — Assessment & Plan Note (Signed)
Reviewed adult health maintenance protocols.  I encouraged weight loss and low saturated fat diet. Patient also advised decrease intake of sweets. He has hypertriglyceridemia. Patient advised to start over-the-counter fish oil supplements twice daily.  He is up to date with adult vaccines.

## 2011-04-27 NOTE — Progress Notes (Signed)
Subjective:    Patient ID: Adrian Schmidt, male    DOB: [birth year], 46 y.o.   MRN: [id number]  HPI  46 year old African American male with history of erectile dysfunction and mild obesity for routine physical. He denies significant interval medical history. He continues to exercise on a regular basis. He has gained some weight since previous visit. Results reviewed in detail with patient.  He admits to fairly unhealthy diet.  Likes sugary beverages.   Review of Systems  Constitutional: Negative for activity change, appetite change and unexpected weight change.  Eyes: Negative for visual disturbance.  Respiratory: Negative for cough, chest tightness and shortness of breath.   Cardiovascular: Negative for chest pain.  Genitourinary: Negative for difficulty urinating.  Neurological: Negative for headaches.  Gastrointestinal: Negative for abdominal pain, heartburn melena or hematochezia Psych: Negative for depression or anxiety        Past Medical History  Diagnosis Date  . Hyperlipidemia   . Herpes genitalia   . Atypical chest pain     negative stress and 2 D echo 2006  . Erectile dysfunction   . Low testosterone     normal iron, LH, FSH and estradiol    History   Social History  . Marital Status: Single    Spouse Name: N/A    Number of Children: 2  . Years of Education: N/A   Occupational History  . [employer]ETOR [employer]   Social History Main Topics  . Smoking status: Never Smoker   . Smokeless tobacco: Not on file  . Alcohol Use: Yes     socially  . Drug Use: Not on file  . Sexually Active: Not on file   Other Topics Concern  . Not on file   Social History Narrative   Regular Exercise:  yes    No past surgical history on file.  Family History  Problem Relation Age of Onset  . Diabetes Maternal Grandmother   . Heart disease Maternal Grandmother     CAD  . Cancer Maternal Grandfather     prostate and colon    No Known Allergies  Current  Outpatient Prescriptions on File Prior to Visit  Medication Sig Dispense Refill  . diclofenac (VOLTAREN) 75 MG EC tablet Take 1 tablet (75 mg total) by mouth 2 (two) times daily.  30 tablet  0  . DISCONTD: vardenafil (LEVITRA) 20 MG tablet Take 1 tablet (20 mg total) by mouth as needed for erectile dysfunction.  10 tablet  3  . fluticasone (FLONASE) 50 MCG/ACT nasal spray Place 2 sprays into the nose daily.  16 g  5    BP 112/76  Pulse 70  Temp(Src) 98.5 F (36.9 C) (Oral)  Ht 6\' 1"  (1.854 m)  Wt 250 lb (113.399 kg)  BMI 32.98 kg/m2    Objective:   Physical Exam  Constitutional: He is oriented to person, place, and time. He appears well-developed and well-nourished.  HENT:  Head: Normocephalic and atraumatic.  Right Ear: External ear normal.  Left Ear: External ear normal.  Mouth/Throat: Oropharynx is clear and moist.       Hearing is grossly normal  Neck: Neck supple.       No carotid bruit  Cardiovascular: Normal rate, regular rhythm and normal heart sounds.   Pulmonary/Chest: Effort normal and breath sounds normal. He has no wheezes. He has no rales.  Abdominal: Soft. Bowel sounds are normal. He exhibits no mass.  Genitourinary: Rectum normal, prostate normal and penis normal. Guaiac  negative stool.  Musculoskeletal: He exhibits no edema.  Lymphadenopathy:    He has no cervical adenopathy.  Neurological: He is alert and oriented to person, place, and time.  Skin: Skin is warm and dry.  Psychiatric: He has a normal mood and affect. His behavior is normal.       Assessment & Plan:

## 2012-01-11 ENCOUNTER — Encounter: Payer: 59 | Admitting: Internal Medicine

## 2012-05-02 ENCOUNTER — Other Ambulatory Visit (INDEPENDENT_AMBULATORY_CARE_PROVIDER_SITE_OTHER): Payer: 59

## 2012-05-02 DIAGNOSIS — Z0000 Encounter for general adult medical examination without abnormal findings: Secondary | ICD-10-CM

## 2012-05-02 LAB — POCT URINALYSIS DIPSTICK
Leukocytes, UA: NEGATIVE
Protein, UA: NEGATIVE
Spec Grav, UA: 1.02
Urobilinogen, UA: 0.2

## 2012-05-02 LAB — CBC WITH DIFFERENTIAL/PLATELET
Basophils Absolute: 0 10*3/uL (ref 0.0–0.1)
Eosinophils Absolute: 0.2 10*3/uL (ref 0.0–0.7)
HCT: 44.4 % (ref 39.0–52.0)
Hemoglobin: 15.5 g/dL (ref 13.0–17.0)
Lymphs Abs: 2.1 10*3/uL (ref 0.7–4.0)
MCHC: 35 g/dL (ref 30.0–36.0)
Neutro Abs: 3.6 10*3/uL (ref 1.4–7.7)
RDW: 13.9 % (ref 11.5–14.6)

## 2012-05-02 LAB — TSH: TSH: 1.44 u[IU]/mL (ref 0.35–5.50)

## 2012-05-05 LAB — BASIC METABOLIC PANEL
CO2: 27 mEq/L (ref 19–32)
Calcium: 9.5 mg/dL (ref 8.4–10.5)
Glucose, Bld: 94 mg/dL (ref 70–99)
Potassium: 4.2 mEq/L (ref 3.5–5.1)
Sodium: 137 mEq/L (ref 135–145)

## 2012-05-05 LAB — LIPID PANEL
Cholesterol: 239 mg/dL — ABNORMAL HIGH (ref 0–200)
Total CHOL/HDL Ratio: 6

## 2012-05-05 LAB — HEPATIC FUNCTION PANEL: Albumin: 4.5 g/dL (ref 3.5–5.2)

## 2012-05-09 ENCOUNTER — Encounter: Payer: 59 | Admitting: Internal Medicine

## 2012-05-22 ENCOUNTER — Ambulatory Visit (INDEPENDENT_AMBULATORY_CARE_PROVIDER_SITE_OTHER): Payer: 59 | Admitting: Internal Medicine

## 2012-05-22 ENCOUNTER — Encounter: Payer: Self-pay | Admitting: Internal Medicine

## 2012-05-22 VITALS — BP 114/78 | HR 72 | Temp 98.4°F | Ht 74.0 in | Wt 260.0 lb

## 2012-05-22 DIAGNOSIS — Z0000 Encounter for general adult medical examination without abnormal findings: Secondary | ICD-10-CM

## 2012-05-22 MED ORDER — KETOCONAZOLE 2 % EX SHAM: MEDICATED_SHAMPOO | Freq: Every day | CUTANEOUS | Status: DC

## 2012-05-22 NOTE — Progress Notes (Signed)
Subjective:    Patient ID: Kaveon Sophronia Simas, male    DOB: [birth year], 47 y.o.   MRN: [id number]  HPI  47 year old [place] American male with history of mild obesity and erectile dysfunction for routine physical.  Patient denies significant interval medical history. Unfortunately he has gained 10 pounds since previous visit. He has not been able to discontinue sugary beverages.   Patient reports he was seen by dermatology in the past. He was treated with ketoconazole shampoo for tinea versicolor. He requests refill.  Screening blood work reviewed in detail with patient.  Review of Systems  Constitutional: Negative for activity change, appetite change and unexpected weight change.  Eyes: Negative for visual disturbance.  Respiratory: Negative for cough, chest tightness and shortness of breath.   Cardiovascular: Negative for chest pain.  Genitourinary: Negative for difficulty urinating.  Neurological: Negative for headaches.  Gastrointestinal: Negative for abdominal pain, heartburn melena or hematochezia Psych: Negative for depression or anxiety         Past Medical History  Diagnosis Date  . Hyperlipidemia   . Herpes genitalia   . Atypical chest pain     negative stress and 2 D echo 2006  . Erectile dysfunction   . Low testosterone     normal iron, LH, FSH and estradiol    History   Social History  . Marital Status: Single    Spouse Name: N/A    Number of Children: 2  . Years of Education: N/A   Occupational History  . [employer]ETOR [employer]   Social History Main Topics  . Smoking status: Never Smoker   . Smokeless tobacco: Not on file  . Alcohol Use: Yes     Comment: socially  . Drug Use: Not on file  . Sexually Active: Not on file   Other Topics Concern  . Not on file   Social History Narrative   Regular Exercise:  yes    No past surgical history on file.  Family History  Problem Relation Age of Onset  . Diabetes Maternal Grandmother   . Heart  disease Maternal Grandmother     CAD  . Cancer Maternal Grandfather     prostate and colon    No Known Allergies  No current outpatient prescriptions on file prior to visit.   No current facility-administered medications on file prior to visit.    BP 114/78  Pulse 72  Temp(Src) 98.4 F (36.9 C) (Oral)  Ht 6\' 2"  (1.88 m)  Wt 260 lb (117.935 kg)  BMI 33.37 kg/m2    Objective:   Physical Exam  Constitutional: He is oriented to person, place, and time. He appears well-developed and well-nourished. No distress.  HENT:  Head: Normocephalic and atraumatic.  Right Ear: External ear normal.  Left Ear: External ear normal.  Mouth/Throat: Oropharynx is clear and moist.  Eyes: Conjunctivae and EOM are normal. Pupils are equal, round, and reactive to light.  Neck: Neck supple.  No carotid bruit  Cardiovascular: Normal rate, regular rhythm and normal heart sounds.   Pulmonary/Chest: Effort normal and breath sounds normal. He has no wheezes.  Abdominal: Soft. Bowel sounds are normal. There is no tenderness.  Genitourinary: Rectum normal and prostate normal. Guaiac negative stool.  Musculoskeletal: Normal range of motion. He exhibits no edema.  Neurological: He is alert and oriented to person, place, and time. No cranial nerve deficit.  Skin: Skin is warm and dry.  Psychiatric: He has a normal mood and affect. His behavior is normal.  Assessment & Plan:

## 2012-05-22 NOTE — Assessment & Plan Note (Signed)
Reviewed adult health maintenance protocols.  I encouraged weight loss.  Handout for low saturated fat diet provided.

## 2012-05-22 NOTE — Patient Instructions (Addendum)
Follow low saturated fat diet Increase aerobic exercise Avoid concentrated sweets

## 2012-06-06 ENCOUNTER — Ambulatory Visit (INDEPENDENT_AMBULATORY_CARE_PROVIDER_SITE_OTHER): Payer: 59 | Admitting: Internal Medicine

## 2012-06-06 ENCOUNTER — Encounter: Payer: Self-pay | Admitting: Internal Medicine

## 2012-06-06 VITALS — BP 122/82 | Temp 98.9°F | Wt 259.0 lb

## 2012-06-06 DIAGNOSIS — B079 Viral wart, unspecified: Secondary | ICD-10-CM

## 2012-06-06 DIAGNOSIS — A630 Anogenital (venereal) warts: Secondary | ICD-10-CM

## 2012-06-06 DIAGNOSIS — Z113 Encounter for screening for infections with a predominantly sexual mode of transmission: Secondary | ICD-10-CM

## 2012-06-06 DIAGNOSIS — R300 Dysuria: Secondary | ICD-10-CM

## 2012-06-06 HISTORY — DX: Encounter for screening for infections with a predominantly sexual mode of transmission: Z11.3

## 2012-06-06 LAB — POCT URINALYSIS DIPSTICK
Blood, UA: NEGATIVE
Leukocytes, UA: NEGATIVE
Protein, UA: NEGATIVE
pH, UA: 7

## 2012-06-06 NOTE — Patient Instructions (Addendum)
Avoid getting incision sites wet for 24 hrs. Keep incision sites clean and dry.  Apply triple antibiotic ointment daily Contact our office if you develop redness, tenderness or drainage

## 2012-06-06 NOTE — Progress Notes (Signed)
  Subjective:    Patient ID: Adrian Schmidt, male    DOB: Feb 21, 1965, 47 y.o.   MRN: [id number]  HPI  47 year old African American male complains of genital warts. Patient had treatment with liquid nitrogen approximately one year ago. Lesion was located at the base of his penis on right side. Wart has grown back. He also has a new spot near left groin. He also requests STD screening.  Patient reports intermittent mild dysuria. No penile discharge. No testicular discomfort.  Review of Systems Negative for fevers, negative genital ulcers  Past Medical History  Diagnosis Date  . Hyperlipidemia   . Herpes genitalia   . Atypical chest pain     negative stress and 2 D echo 2006  . Erectile dysfunction   . Low testosterone     normal iron, LH, FSH and estradiol    History   Social History  . Marital Status: Single    Spouse Name: N/A    Number of Children: 2  . Years of Education: N/A   Occupational History  . [employer]OR [employer]   Social History Main Topics  . Smoking status: Never Smoker   . Smokeless tobacco: Not on file  . Alcohol Use: Yes     Comment: socially  . Drug Use: Not on file  . Sexually Active: Not on file   Other Topics Concern  . Not on file   Social History Narrative   Regular Exercise:  yes    No past surgical history on file.  Family History  Problem Relation Age of Onset  . Diabetes Maternal Grandmother   . Heart disease Maternal Grandmother     CAD  . Cancer Maternal Grandfather     prostate and colon    No Known Allergies  Current Outpatient Prescriptions on File Prior to Visit  Medication Sig Dispense Refill  . ketoconazole (NIZORAL) 2 % shampoo Apply topically daily. Apply daily for 3 days  120 mL  3   No current facility-administered medications on file prior to visit.    BP 122/82  Temp(Src) 98.9 F (37.2 C) (Oral)  Wt 259 lb (117.482 kg)  BMI 33.24 kg/m2       Objective:   Physical Exam  Constitutional: He  appears well-developed and well-nourished.  Cardiovascular: Normal rate, regular rhythm and normal heart sounds.   Pulmonary/Chest: Effort normal and breath sounds normal. He has no wheezes.  Skin:  6-8 mm flat warty lesion base of penis right side, similar sized lesion left groin          Assessment & Plan:

## 2012-06-06 NOTE — Assessment & Plan Note (Signed)
Patient requests STD screening. He experiences intermittent mild dysuria. See orders.

## 2012-06-06 NOTE — Assessment & Plan Note (Signed)
Discussed risk and benefits of procedure. Patient agreed to proceed. Area prepped with Betadine sticks. Utilized sterile technique to perform shave excision on 2 lesions. 0.8 mL of 2% lidocaine use for anesthetic. Forceps and Dermablade x 2 used for excision.  Specimens sent to pathology. Minimal bleeding cauterized. After care discussed. Reassess in 2 weeks.

## 2012-06-07 LAB — HIV ANTIBODY (ROUTINE TESTING W REFLEX): HIV: NONREACTIVE

## 2012-06-09 LAB — HSV 2 ANTIBODY, IGG: HSV 2 Glycoprotein G Ab, IgG: 9.73 IV — ABNORMAL HIGH

## 2012-09-25 ENCOUNTER — Telehealth: Payer: Self-pay | Admitting: Internal Medicine

## 2012-09-25 DIAGNOSIS — G4730 Sleep apnea, unspecified: Secondary | ICD-10-CM

## 2012-09-25 NOTE — Telephone Encounter (Signed)
Pt would like a referral to [facility]. Pt stated he has sleep apnea

## 2012-09-25 NOTE — Telephone Encounter (Signed)
Arrange referral to Dr. Vassie Loll.  He will arrange sleep study

## 2012-09-26 NOTE — Telephone Encounter (Signed)
Left message for pt to call back  °

## 2012-09-26 NOTE — Telephone Encounter (Signed)
Referral order placed, pt aware 

## 2012-11-04 ENCOUNTER — Institutional Professional Consult (permissible substitution): Payer: 59 | Admitting: Pulmonary Disease

## 2012-11-14 ENCOUNTER — Ambulatory Visit (INDEPENDENT_AMBULATORY_CARE_PROVIDER_SITE_OTHER): Payer: 59 | Admitting: Pulmonary Disease

## 2012-11-14 ENCOUNTER — Encounter: Payer: Self-pay | Admitting: Pulmonary Disease

## 2012-11-14 VITALS — BP 118/88 | HR 90 | Temp 97.9°F | Ht 73.5 in | Wt 264.4 lb

## 2012-11-14 DIAGNOSIS — G4733 Obstructive sleep apnea (adult) (pediatric): Secondary | ICD-10-CM | POA: Insufficient documentation

## 2012-11-14 HISTORY — DX: Obstructive sleep apnea (adult) (pediatric): G47.33

## 2012-11-14 NOTE — Assessment & Plan Note (Signed)
The patient's history is suggestive of clinically significant sleep apnea.  He is a loud snorer, describes nonrestorative sleep, and also has definite sleepiness with inactivity during the day and evening.  I had a long discussion with him about the pathophysiology of sleep apnea, including its impact to his quality of life and cardiovascular health.  I think he needs to have a sleep study done, and he would be a good candidate for home sleep testing.

## 2012-11-14 NOTE — Patient Instructions (Signed)
Will schedule for home sleep testing.  Will arrange followup once the results are available. Work on weight loss  

## 2012-11-14 NOTE — Progress Notes (Signed)
  Subjective:    Patient ID: Adrian Schmidt, male    DOB: [birth year], 47 y.o.   MRN: [id number]  HPI The patient is a 47 year old male who been asked to see for possible obstructive sleep apnea.  He has been noted to have loud snoring, as well as an abnormal breathing pattern during sleep.  He has rare awakenings at night, but he is not rested in the mornings upon arising.  He notes definite inappropriate sleepiness during the day, and will also fall asleep easily in the evenings watching television or movies.  He has occasional sleep pressure driving longer distances.  Patient states his weight is down 10 pounds over the last 2 years, and his Epworth score is 16.   Sleep Questionnaire What time do you typically go to bed?( Between what hours) 11p-2am 11p-2am at 1545 on 11/14/12 by Maisie Fus, CMA How long does it take you to fall asleep?  at 1545 on 11/14/12 by Maisie Fus, CMA How many times during the night do you wake up? 1 1 at 1545 on 11/14/12 by Maisie Fus, CMA What time do you get out of bed to start your day? 0900 0900 at 1545 on 11/14/12 by Maisie Fus, CMA Do you drive or operate heavy machinery in your occupation? No No at 1545 on 11/14/12 by Maisie Fus, CMA How much has your weight changed (up or down) over the past two years? (In pounds) 10 lb (4.536 kg)10 lb (4.536 kg) increase at 1545 on 11/14/12 by Maisie Fus, CMA Have you ever had a sleep study before? No No at 1545 on 11/14/12 by Maisie Fus, CMA Do you currently use CPAP? No No at 1545 on 11/14/12 by Maisie Fus, CMA Do you wear oxygen at any time? No No at 1545 on 11/14/12 by Maisie Fus, CMA   Review of Systems  Constitutional: Negative for fever and unexpected weight change.  HENT: Negative for congestion, dental problem, ear pain, nosebleeds, postnasal drip, rhinorrhea, sinus pressure, sneezing, sore throat and trouble swallowing.   Eyes: Negative for redness  and itching.  Respiratory: Positive for shortness of breath. Negative for cough, chest tightness and wheezing.   Cardiovascular: Negative for palpitations and leg swelling.  Gastrointestinal: Negative for nausea and vomiting.  Genitourinary: Negative for dysuria.  Musculoskeletal: Negative for joint swelling.  Skin: Negative for rash.  Neurological: Negative for headaches.  Hematological: Does not bruise/bleed easily.  Psychiatric/Behavioral: Negative for dysphoric mood. The patient is not nervous/anxious.        Objective:   Physical Exam Constitutional:  Overweight male, no acute distress  HENT:  Nares patent without discharge, mild turbinate hypertrophy  Oropharynx without exudate, palate and uvula are moderately elongated.   Eyes:  Perrla, eomi, no scleral icterus  Neck:  No JVD, no TMG  Cardiovascular:  Normal rate, regular rhythm, no rubs or gallops.  No murmurs        Intact distal pulses  Pulmonary :  Normal breath sounds, no stridor or respiratory distress   No rales, rhonchi, or wheezing  Abdominal:  Soft, nondistended, bowel sounds present.  No tenderness noted.   Musculoskeletal:  No lower extremity edema noted.  Lymph Nodes:  No cervical lymphadenopathy noted  Skin:  No cyanosis noted  Neurologic:  Mildly sleepy but appropriate, moves all 4 extremities without obvious deficit.         Assessment & Plan:

## 2012-11-19 DIAGNOSIS — G4733 Obstructive sleep apnea (adult) (pediatric): Secondary | ICD-10-CM

## 2012-11-24 ENCOUNTER — Telehealth: Payer: Self-pay | Admitting: Pulmonary Disease

## 2012-11-24 DIAGNOSIS — G4733 Obstructive sleep apnea (adult) (pediatric): Secondary | ICD-10-CM

## 2012-11-24 NOTE — Telephone Encounter (Signed)
Review sleep study-- appt 01/06/13 at 1045 with [facility]

## 2012-11-24 NOTE — Telephone Encounter (Signed)
Pt needs ov to review recent sleep study.  

## 2012-11-25 ENCOUNTER — Encounter: Payer: Self-pay | Admitting: Pulmonary Disease

## 2013-01-06 ENCOUNTER — Encounter: Payer: Self-pay | Admitting: Pulmonary Disease

## 2013-01-06 ENCOUNTER — Encounter (INDEPENDENT_AMBULATORY_CARE_PROVIDER_SITE_OTHER): Payer: Self-pay

## 2013-01-06 ENCOUNTER — Ambulatory Visit (INDEPENDENT_AMBULATORY_CARE_PROVIDER_SITE_OTHER): Payer: 59 | Admitting: Pulmonary Disease

## 2013-01-06 VITALS — BP 120/84 | HR 70 | Ht 74.0 in | Wt 255.0 lb

## 2013-01-06 DIAGNOSIS — G4733 Obstructive sleep apnea (adult) (pediatric): Secondary | ICD-10-CM

## 2013-01-06 NOTE — Patient Instructions (Signed)
Work on weight loss and leaner muscle mass as we discussed. Will start on cpap as a trial at a moderate pressure level.  Please call if you are having tolerance issues. Will see you back in 8 weeks to check on your progress.

## 2013-01-06 NOTE — Progress Notes (Signed)
   Subjective:    Patient ID: Phenix Saddie Benders, male    DOB: [birth year], 48 y.o.   MRN: [id number]  HPI Patient comes in today for followup of his recent sleep study. He was found to have moderate to severe obstructive sleep apnea, with an AHI of 34 events per hour and oxygen desaturation as low as 76%. I have reviewed the study with him in detail, and answered all of his questions.   Review of Systems  Constitutional: Negative for fever and unexpected weight change.  HENT: Negative for congestion, dental problem, ear pain, nosebleeds, postnasal drip, rhinorrhea, sinus pressure, sneezing, sore throat and trouble swallowing.   Eyes: Negative for redness and itching.  Respiratory: Negative for cough, chest tightness, shortness of breath and wheezing.   Cardiovascular: Negative for palpitations and leg swelling.  Gastrointestinal: Negative for nausea and vomiting.  Genitourinary: Negative for dysuria.  Musculoskeletal: Negative for joint swelling.  Skin: Negative for rash.  Neurological: Negative for headaches.  Hematological: Does not bruise/bleed easily.  Psychiatric/Behavioral: Negative for dysphoric mood. The patient is not nervous/anxious.        Objective:   Physical Exam Obese male in no acute distress Nose without purulence or discharge noted Neck without lymphadenopathy or thyromegaly Lower extremities without edema, no cyanosis Alert and oriented, moves all 4 extremities.       Assessment & Plan:

## 2013-01-06 NOTE — Assessment & Plan Note (Signed)
The patient has moderate to severe obstructive sleep apnea by his recent home sleep testing, and I have reviewed the various treatment options with him. I have recommended a trial of weight loss while he is wearing CPAP, and the patient is agreeable to this approach. I will start him on a moderate pressure level to allow for desensitization, and then will use the automatic setting to optimize his pressure.

## 2013-01-26 ENCOUNTER — Telehealth: Payer: Self-pay | Admitting: Pulmonary Disease

## 2013-01-26 NOTE — Telephone Encounter (Signed)
Will forward to [place] as FYI 

## 2013-01-26 NOTE — Telephone Encounter (Signed)
Pt's cpap on hold until he investigates finances with insurance company.  Await his call, and will re-order cpap.

## 2013-02-25 ENCOUNTER — Telehealth: Payer: Self-pay | Admitting: Pulmonary Disease

## 2013-02-25 DIAGNOSIS — G4733 Obstructive sleep apnea (adult) (pediatric): Secondary | ICD-10-CM

## 2013-02-25 NOTE — Telephone Encounter (Signed)
ATC PT NA mailbox is full and not accepting any messages at this time. wcb

## 2013-02-27 NOTE — Telephone Encounter (Signed)
Spoke with the pt  He states that he did not like the conversation that he had with [facility]  He wants to switch DME's  Has not even got started on CPAP  He has researched other companies and wants to go with a different one, but does not have the information at this time on which one He will call back later to give name of new DME

## 2013-03-02 NOTE — Telephone Encounter (Signed)
I spoke with the pt and he states he wants to go with [employer] # [id number]. Order has been placed. [place], CMA

## 2013-03-03 ENCOUNTER — Ambulatory Visit: Payer: 59 | Admitting: Pulmonary Disease

## 2013-03-09 ENCOUNTER — Telehealth: Payer: Self-pay | Admitting: Pulmonary Disease

## 2013-03-09 NOTE — Telephone Encounter (Signed)
Done and given to ashtyn to fax.

## 2013-03-09 NOTE — Telephone Encounter (Signed)
Fax received and placed in [place] look at. Please advise thanks 

## 2013-03-09 NOTE — Telephone Encounter (Signed)
Wanting to know if fax was receive from [employer] [place]

## 2013-03-09 NOTE — Telephone Encounter (Signed)
Called and spoke with [place] from [email or web address]. He reports they faxed over CMN for pt. He is going to refax this to triage. Will await fax.

## 2013-03-10 NOTE — Telephone Encounter (Signed)
This has been faxed back to company.  Fax # [phone] Form placed to scan. Nothing further needed.

## 2013-03-17 ENCOUNTER — Telehealth: Payer: Self-pay | Admitting: Internal Medicine

## 2013-03-17 NOTE — Telephone Encounter (Signed)
error 

## 2013-03-18 ENCOUNTER — Encounter: Payer: Self-pay | Admitting: Family

## 2013-03-18 ENCOUNTER — Ambulatory Visit (INDEPENDENT_AMBULATORY_CARE_PROVIDER_SITE_OTHER): Payer: 59 | Admitting: Family

## 2013-03-18 VITALS — BP 120/80 | HR 80 | Temp 98.1°F | Wt 260.0 lb

## 2013-03-18 DIAGNOSIS — H1010 Acute atopic conjunctivitis, unspecified eye: Secondary | ICD-10-CM

## 2013-03-18 DIAGNOSIS — H1045 Other chronic allergic conjunctivitis: Secondary | ICD-10-CM

## 2013-03-18 DIAGNOSIS — J309 Allergic rhinitis, unspecified: Secondary | ICD-10-CM

## 2013-03-18 MED ORDER — FLUTICASONE PROPIONATE 50 MCG/ACT NA SUSP: 2.0000 | Freq: Every day | NASAL | Status: DC

## 2013-03-18 NOTE — Progress Notes (Signed)
Subjective:    Patient ID: Adrian Schmidt, male    DOB: 02-Feb-1965, 48 y.o.   MRN: [id number]  HPI 48 y.o. Black male presents today with chief complaint of "sinus flare up". Pt states that he began having a runny nose, and congestion two weeks ago. He states that he has taken zyrtec and benadryl but has received no relief. Pt also acknowledges that he uses a C-pap and has not been able to keep it on due to congestion. Pt denies fever, fatigue, malaise and change in appetite.     Review of Systems  Constitutional: Negative.   Eyes: Negative.   Respiratory: Negative.   Cardiovascular: Negative.   Gastrointestinal: Negative.   Endocrine: Negative.   Genitourinary: Negative.   Musculoskeletal: Negative.   Skin: Negative.   Allergic/Immunologic: Positive for environmental allergies.  Neurological: Negative.   Hematological: Negative.   Psychiatric/Behavioral: Negative.    Past Medical History  Diagnosis Date  . Hyperlipidemia   . Herpes genitalia   . Atypical chest pain     negative stress and 2 D echo 2006  . Erectile dysfunction   . Low testosterone     normal iron, LH, FSH and estradiol    History   Social History  . Marital Status: Single    Spouse Name: N/A    Number of Children: 2  . Years of Education: N/A   Occupational History  . EXPIDETOR [employer]   Social History Main Topics  . Smoking status: Never Smoker   . Smokeless tobacco: Not on file  . Alcohol Use: Yes     Comment: socially  . Drug Use: No  . Sexual Activity: Not on file   Other Topics Concern  . Not on file   Social History Narrative   Regular Exercise:  yes    Past Surgical History  Procedure Laterality Date  . Small intestine surgery      stretching of small intestine-- ring removal. Dr. Diona Fanti    Family History  Problem Relation Age of Onset  . Diabetes Maternal Grandmother   . Heart disease Maternal Grandmother     CAD  . Cancer Maternal Grandfather     prostate  and colon    No Known Allergies  Current Outpatient Prescriptions on File Prior to Visit  Medication Sig Dispense Refill  . ketoconazole (NIZORAL) 2 % shampoo Apply topically daily. Apply daily for 3 days  120 mL  3   No current facility-administered medications on file prior to visit.    BP 120/80  Pulse 80  Temp(Src) 98.1 F (36.7 C) (Oral)  Wt 260 lb (117.935 kg)  SpO2 98%chart    Objective:   Physical Exam  Constitutional: He is oriented to person, place, and time. He appears well-developed and well-nourished. He is active.  HENT:  Head: Normocephalic.  Right Ear: Tympanic membrane, external ear and ear canal normal.  Left Ear: Tympanic membrane, external ear and ear canal normal.  Nose: Nose normal.  Mouth/Throat: Uvula is midline, oropharynx is clear and moist and mucous membranes are normal.  Eyes: Conjunctivae and lids are normal.  Positive for mildly red conjunctiva.   Cardiovascular: Normal rate, regular rhythm and normal heart sounds.   Pulmonary/Chest: Effort normal and breath sounds normal.  Neurological: He is alert and oriented to person, place, and time.  Skin: Skin is warm, dry and intact.  Psychiatric: He has a normal mood and affect. His speech is normal and behavior is normal. Thought  content normal.          Assessment & Plan:  48 y.o. Black male seen today with chief complaint of "sinus flare up".  - Allergic rhinitis: Flonase 2 puff intranasal   - Continue taking OTC zyrtec  Education: Avoid environmental allergens that are triggers.   Follow up: as needed.

## 2013-03-18 NOTE — Patient Instructions (Signed)
Allergic Rhinitis Allergic rhinitis is when the mucous membranes in the nose respond to allergens. Allergens are particles in the air that cause your body to have an allergic reaction. This causes you to release allergic antibodies. Through a chain of events, these eventually cause you to release histamine into the blood stream. Although meant to protect the body, it is this release of histamine that causes your discomfort, such as frequent sneezing, congestion, and an itchy, runny nose.  CAUSES  Seasonal allergic rhinitis (hay fever) is caused by pollen allergens that may come from grasses, trees, and weeds. Year-round allergic rhinitis (perennial allergic rhinitis) is caused by allergens such as house dust mites, pet dander, and mold spores.  SYMPTOMS   Nasal stuffiness (congestion).  Itchy, runny nose with sneezing and tearing of the eyes. DIAGNOSIS  Your health care provider can help you determine the allergen or allergens that trigger your symptoms. If you and your health care provider are unable to determine the allergen, skin or blood testing may be used. TREATMENT  Allergic Rhinitis does not have a cure, but it can be controlled by:  Medicines and allergy shots (immunotherapy).  Avoiding the allergen. Hay fever may often be treated with antihistamines in pill or nasal spray forms. Antihistamines block the effects of histamine. There are over-the-counter medicines that may help with nasal congestion and swelling around the eyes. Check with your health care provider before taking or giving this medicine.  If avoiding the allergen or the medicine prescribed do not work, there are many new medicines your health care provider can prescribe. Stronger medicine may be used if initial measures are ineffective. Desensitizing injections can be used if medicine and avoidance does not work. Desensitization is when a patient is given ongoing shots until the body becomes less sensitive to the allergen.  Make sure you follow up with your health care provider if problems continue. HOME CARE INSTRUCTIONS It is not possible to completely avoid allergens, but you can reduce your symptoms by taking steps to limit your exposure to them. It helps to know exactly what you are allergic to so that you can avoid your specific triggers. SEEK MEDICAL CARE IF:   You have a fever.  You develop a cough that does not stop easily (persistent).  You have shortness of breath.  You start wheezing.  Symptoms interfere with normal daily activities. Document Released: 09/12/2000 Document Revised: 10/08/2012 Document Reviewed: 08/25/2012 ExitCare Patient Information 2014 [employer].  

## 2013-03-18 NOTE — Progress Notes (Signed)
Pre visit review using our clinic review tool, if applicable. No additional management support is needed unless otherwise documented below in the visit note. 

## 2013-04-07 ENCOUNTER — Telehealth: Payer: Self-pay | Admitting: Pulmonary Disease

## 2013-04-07 DIAGNOSIS — G4733 Obstructive sleep apnea (adult) (pediatric): Secondary | ICD-10-CM

## 2013-04-07 NOTE — Telephone Encounter (Signed)
Spoke with the pt  He is c/o CPAP pressure too high  He states that the pressure starts at 4 and then goes to 10  He wakes up in the night feeling like he is being smothered  Pt is currently using a FFM and wonders if he should try a nasal mask   Please advise, thanks!

## 2013-04-07 NOTE — Telephone Encounter (Signed)
Let pt know that 4-10 is really a fairly low pressure.  I would try a different mask before changing pressure.  If he is unable to keep his mouth closed with a nasal mask, will need chin strap.  Let us know how it is going.

## 2013-04-07 NOTE — Telephone Encounter (Signed)
Spoke with the pt and notified of recs per [facility] He verbalized understanding  Order was sent to [facility]  Nothing further needed

## 2013-04-17 ENCOUNTER — Other Ambulatory Visit: Payer: Self-pay | Admitting: Pulmonary Disease

## 2013-04-17 ENCOUNTER — Encounter: Payer: Self-pay | Admitting: Pulmonary Disease

## 2013-04-17 ENCOUNTER — Ambulatory Visit (INDEPENDENT_AMBULATORY_CARE_PROVIDER_SITE_OTHER): Payer: 59 | Admitting: Pulmonary Disease

## 2013-04-17 ENCOUNTER — Encounter (INDEPENDENT_AMBULATORY_CARE_PROVIDER_SITE_OTHER): Payer: Self-pay

## 2013-04-17 VITALS — BP 112/64 | HR 58 | Temp 98.4°F | Ht 74.0 in | Wt 258.4 lb

## 2013-04-17 DIAGNOSIS — G4733 Obstructive sleep apnea (adult) (pediatric): Secondary | ICD-10-CM

## 2013-04-17 NOTE — Progress Notes (Signed)
   Subjective:    Patient ID: Adrian Schmidt, male    DOB: 02/13/1965, 48 y.o.   MRN: [id number]  HPI The patient comes in today for followup of his obstructive sleep apnea. He is been wearing his CPAP, but has been having some issues with the 10 cm of pressure. It is unclear if this is because of breakthrough apnea, or whether it is an actual pressure issue.  He had to change to a nasal mask, and admits that he is having some oral venting. He has seen definite improvement in his sleep and daytime alertness.   Review of Systems  Constitutional: Negative for fever and unexpected weight change.  HENT: Negative for congestion, dental problem, ear pain, nosebleeds, postnasal drip, rhinorrhea, sinus pressure, sneezing ( allergies), sore throat and trouble swallowing.   Eyes: Negative for redness and itching.  Respiratory: Negative for cough, chest tightness, shortness of breath and wheezing.   Cardiovascular: Negative for palpitations and leg swelling.  Gastrointestinal: Negative for nausea and vomiting.  Genitourinary: Negative for dysuria.  Musculoskeletal: Negative for joint swelling.  Skin: Negative for rash.  Neurological: Negative for headaches.  Hematological: Does not bruise/bleed easily.  Psychiatric/Behavioral: Negative for dysphoric mood. The patient is not nervous/anxious.        Objective:   Physical Exam Overweight male in no acute distress Nose without purulence or discharge noted No skin breakdown or pressure necrosis from the CPAP Neck without lymphadenopathy or thyromegaly Lower extremities without edema, no cyanosis Alert and oriented, moves all 4 extremities.       Assessment & Plan:

## 2013-04-17 NOTE — Patient Instructions (Signed)
Will change your cpap to the auto setting. Add chin strap to your nasal mask to help with mouth opening. Work on weight loss followup with me again in 34mos, but call if having tolerance issues.

## 2013-04-17 NOTE — Assessment & Plan Note (Signed)
The patient has been wearing CPAP compliantly, but has been having issues with the pressure of 10 cm from a tolerance standpoint. Will change him over to the auto setting, and hopefully this will take care of this problem.  Will also add a chin strap to his nasal mask since he is having some oral venting.

## 2013-06-23 ENCOUNTER — Encounter: Payer: Self-pay | Admitting: Pulmonary Disease

## 2013-06-23 ENCOUNTER — Telehealth: Payer: Self-pay | Admitting: Pulmonary Disease

## 2013-06-23 DIAGNOSIS — G4733 Obstructive sleep apnea (adult) (pediatric): Secondary | ICD-10-CM

## 2013-06-23 NOTE — Telephone Encounter (Signed)
Please send an order to dme to change his auto to 5-10cm, and lets see if that is better for him.  Thanks.

## 2013-06-23 NOTE — Telephone Encounter (Signed)
Pt aware of recs. Order placed. Nothing further needed 

## 2013-06-23 NOTE — Telephone Encounter (Signed)
Spoke w/ pt. He is having problems with his CPAP. About 2-3 hrs after putting it on, he is taking it off d/t what he believes the pressure being to high. Once he takes it off he falls asleep before he can put it back on. Pt on auto setting. Download printed and placed in [facility] look at. Please advise thanks

## 2013-10-23 ENCOUNTER — Ambulatory Visit (INDEPENDENT_AMBULATORY_CARE_PROVIDER_SITE_OTHER): Payer: 59 | Admitting: Pulmonary Disease

## 2013-10-23 ENCOUNTER — Encounter: Payer: Self-pay | Admitting: Pulmonary Disease

## 2013-10-23 VITALS — BP 118/62 | HR 63 | Temp 97.4°F | Ht 74.0 in | Wt 259.8 lb

## 2013-10-23 DIAGNOSIS — G4733 Obstructive sleep apnea (adult) (pediatric): Secondary | ICD-10-CM

## 2013-10-23 NOTE — Assessment & Plan Note (Signed)
The patient is continuing to have issues with his CPAP pressure, but more so his mask fit. He feels very strongly that when he is able to wear the device through the night, he has significantly improved sleep. I would like to send him to the sleep Center for a formal mask fitting, and will increase his upper end pressure on the automatic setting to 12 cm.  it the patient is doing well after a new mask on this new pressure, he is to call so that we can gradually increase this to a more therapeutic level. I also encouraged him to work aggressively on weight loss.

## 2013-10-23 NOTE — Progress Notes (Signed)
   Subjective:    Patient ID: Adrian Schmidt, male    DOB: Mar 30, 1965, 48 y.o.   MRN: [id number]  HPI The patient comes in today for followup of his obstructive sleep apnea. He has been having issues with CPAP tolerance, but clearly sees a difference in his sleep when he is able to wear it consistently. He feels his main issue currently is related to his mask fit, although he has had pressure tolerance issues even at 10 cm of water. I have explained to him this is suboptimal pressure and really not very high. He is more than willing to keep working on desensitization so that we can properly treat his sleep apnea. I also discussed with him the possibility of a dental appliance, which may not fully treat his sleep apnea but will decrease it a significant amount.   Review of Systems  Constitutional: Negative for fever and unexpected weight change.  HENT: Negative for congestion, dental problem, ear pain, nosebleeds, postnasal drip, rhinorrhea, sinus pressure, sneezing, sore throat and trouble swallowing.   Eyes: Negative for redness and itching.  Respiratory: Negative for cough, chest tightness, shortness of breath and wheezing.   Cardiovascular: Negative for palpitations and leg swelling.  Gastrointestinal: Negative for nausea and vomiting.  Genitourinary: Negative for dysuria.  Musculoskeletal: Negative for joint swelling.  Skin: Negative for rash.  Neurological: Negative for seizures and headaches.  Hematological: Does not bruise/bleed easily.  Psychiatric/Behavioral: Negative for dysphoric mood. The patient is not nervous/anxious.        Objective:   Physical Exam Overweight male in no acute distress Nose without purulence or discharge noted No skin breakdown or pressure necrosis from the CPAP mask Neck without lymphadenopathy or thyromegaly Lower extremities with no significant edema, no cyanosis Alert and oriented, moves all 4 extremities.       Assessment & Plan:

## 2013-10-23 NOTE — Patient Instructions (Signed)
Will increase your pressure to 5-12cm Will arrange for fitting session at sleep center for a new mask. Work on weight loss followup with me again in 67mos, but call if you are not doing well.

## 2013-11-09 ENCOUNTER — Ambulatory Visit (HOSPITAL_BASED_OUTPATIENT_CLINIC_OR_DEPARTMENT_OTHER): Payer: 59 | Attending: Pulmonary Disease | Admitting: Radiology

## 2013-11-09 DIAGNOSIS — G4733 Obstructive sleep apnea (adult) (pediatric): Secondary | ICD-10-CM

## 2013-11-09 DIAGNOSIS — Z9989 Dependence on other enabling machines and devices: Principal | ICD-10-CM

## 2014-04-30 ENCOUNTER — Ambulatory Visit: Payer: 59 | Admitting: Pulmonary Disease

## 2014-05-03 ENCOUNTER — Other Ambulatory Visit: Payer: Self-pay | Admitting: Internal Medicine

## 2014-05-03 ENCOUNTER — Other Ambulatory Visit: Payer: Self-pay | Admitting: Family

## 2014-06-04 ENCOUNTER — Ambulatory Visit: Payer: Self-pay | Admitting: Internal Medicine

## 2014-06-04 ENCOUNTER — Ambulatory Visit (INDEPENDENT_AMBULATORY_CARE_PROVIDER_SITE_OTHER): Payer: 59 | Admitting: Pulmonary Disease

## 2014-06-04 ENCOUNTER — Encounter: Payer: Self-pay | Admitting: Pulmonary Disease

## 2014-06-04 VITALS — BP 122/74 | HR 91 | Temp 98.0°F | Ht 74.0 in | Wt 264.0 lb

## 2014-06-04 DIAGNOSIS — G4733 Obstructive sleep apnea (adult) (pediatric): Secondary | ICD-10-CM

## 2014-06-04 NOTE — Patient Instructions (Signed)
Will send an order to your home care company to work with you on mask/hose interface Will have your pressure increased to 5-15, for better control of your sleep apnea Keep working on weight loss followup with Dr. Halford Chessman in one year, but call if having issues.

## 2014-06-04 NOTE — Progress Notes (Signed)
   Subjective:    Patient ID: Adrian Schmidt, male    DOB: Sep 09, 1965, 49 y.o.   MRN: [id number]  HPI The patient comes in today for follow-up of his obstructive sleep apnea. He is trying to wear C compliantly, but is currently having issues with his hoses/mask interface. His download today shows breakthrough apneas on his current pressure of 5-10 on the auto setting. He really has not been wearing C Pap enough to see if this made a significant difference to his sleep or daytime alertness.   Review of Systems  Constitutional: Negative for fever and unexpected weight change.  HENT: Negative for congestion, dental problem, ear pain, nosebleeds, postnasal drip, rhinorrhea, sinus pressure, sneezing, sore throat and trouble swallowing.   Eyes: Negative for redness and itching.  Respiratory: Negative for cough, chest tightness, shortness of breath and wheezing.   Cardiovascular: Negative for palpitations and leg swelling.  Gastrointestinal: Negative for nausea and vomiting.  Genitourinary: Negative for dysuria.  Musculoskeletal: Negative for joint swelling.  Skin: Negative for rash.  Neurological: Negative for headaches.  Hematological: Does not bruise/bleed easily.  Psychiatric/Behavioral: Negative for dysphoric mood. The patient is not nervous/anxious.        Objective:   Physical Exam Obese male in nad Nose without purulence or d/c noted Neck without LN or TMG No skin breakdown or pressure necrosis from the cpap mask LE without edema, no cyanosis Alert and oriented, moves all 4.        Assessment & Plan:

## 2014-06-04 NOTE — Assessment & Plan Note (Signed)
The patient continues to struggle with C Pap despite having a good mask fit. He is missing a piece that interfaces his hose with his new mask,  and prior to that was having significant breakthrough apneas on auto 5-10.  Will have his dme work with him on his hose interface, and also increase his C Pap pressure to 5-15. Also encouraged him to be more compliant, and to work aggressively on weight loss.

## 2014-06-29 ENCOUNTER — Encounter: Payer: Self-pay | Admitting: Adult Health

## 2015-02-26 ENCOUNTER — Other Ambulatory Visit: Payer: Self-pay | Admitting: Family

## 2015-06-15 ENCOUNTER — Ambulatory Visit: Payer: 59 | Admitting: Pulmonary Disease

## 2015-09-30 ENCOUNTER — Ambulatory Visit: Payer: 59 | Admitting: Pulmonary Disease

## 2015-11-09 DIAGNOSIS — K219 Gastro-esophageal reflux disease without esophagitis: Secondary | ICD-10-CM | POA: Insufficient documentation

## 2015-11-09 DIAGNOSIS — G8929 Other chronic pain: Secondary | ICD-10-CM | POA: Insufficient documentation

## 2015-11-09 DIAGNOSIS — M25562 Pain in left knee: Secondary | ICD-10-CM | POA: Insufficient documentation

## 2015-11-09 DIAGNOSIS — R059 Cough, unspecified: Secondary | ICD-10-CM | POA: Insufficient documentation

## 2015-11-09 HISTORY — DX: Pain in left knee: M25.562

## 2015-11-09 HISTORY — DX: Cough, unspecified: R05.9

## 2015-11-09 HISTORY — DX: Other chronic pain: G89.29

## 2016-03-17 ENCOUNTER — Encounter: Payer: Self-pay | Admitting: Adult Health

## 2016-03-23 ENCOUNTER — Telehealth: Payer: Self-pay | Admitting: Adult Health

## 2016-03-23 DIAGNOSIS — G4733 Obstructive sleep apnea (adult) (pediatric): Secondary | ICD-10-CM

## 2016-03-23 NOTE — Telephone Encounter (Signed)
lmtcb for pt.  

## 2016-03-23 NOTE — Telephone Encounter (Signed)
Pt returning call.Adrian Schmidt ° °

## 2016-03-23 NOTE — Telephone Encounter (Signed)
lmom tcb x1   fyi: To nurse download in placed in JJ's cubby

## 2016-03-26 NOTE — Telephone Encounter (Signed)
Called and spoke with pt and he stated that he would like to try and get supplies from [facility].  Pt is aware we will send this over to TP for approval of order.  Pt does have pending appt with TP on 4/3. Last appt with [facility] on 6/16. TP please advise. Thanks  No Known Allergies

## 2016-03-26 NOTE — Telephone Encounter (Signed)
Pt aware of recs.  Order placed to [facility].  Nothing further needed.  

## 2016-03-26 NOTE — Telephone Encounter (Addendum)
Download given to TP for review: Okay for supplies.  He needs to increase usage each day.  Download shows usage 8/30 days.

## 2016-03-29 ENCOUNTER — Telehealth: Payer: Self-pay | Admitting: Adult Health

## 2016-03-29 NOTE — Telephone Encounter (Signed)
Called [facility] and spoke to [place]. He states they are unable to change pt from [facility] to [facility] until pt is seen on 4.3.18. ATC pt to inform him, VM is full. WCB.

## 2016-04-02 NOTE — Telephone Encounter (Signed)
Called and spoke to pt. Informed him of the recs per [place]. Pt verbalized understanding and denied any further questions or concerns at this time.

## 2016-04-03 ENCOUNTER — Encounter: Payer: Self-pay | Admitting: Adult Health

## 2016-04-03 ENCOUNTER — Ambulatory Visit (INDEPENDENT_AMBULATORY_CARE_PROVIDER_SITE_OTHER): Payer: 59 | Admitting: Adult Health

## 2016-04-03 DIAGNOSIS — G4733 Obstructive sleep apnea (adult) (pediatric): Secondary | ICD-10-CM | POA: Diagnosis not present

## 2016-04-03 NOTE — Assessment & Plan Note (Signed)
Encouraged on compliance .  Pt eduation given   Plan  Patient Instructions  On C Pap at bedtime. Wear each night, try to get at least 4-6 hours. Work on weight loss.  Do not drive a sleepy C Pap download in 2 months. Order for supplies and mask fit to the home care company Follow up with Dr. Halford Chessman  In 6 months and As needed

## 2016-04-03 NOTE — Patient Instructions (Signed)
On C Pap at bedtime. Wear each night, try to get at least 4-6 hours. Work on weight loss.  Do not drive a sleepy C Pap download in 2 months. Order for supplies and mask fit to the home care company Follow up with Dr. Halford Chessman  In 6 months and As needed

## 2016-04-03 NOTE — Addendum Note (Signed)
Addended by: Parke Poisson E on: 04/03/2016 04:32 PM   Modules accepted: Orders

## 2016-04-03 NOTE — Progress Notes (Signed)
@Patient  ID: Adrian Schmidt, male    DOB: 17-Oct-1965, 51 y.o.   MRN: [id number]  Chief Complaint  Patient presents with  . Follow-up    OSA    Referring provider: No ref. provider found  HPI: 51 year old male followed for Severe obstructive sleep apnea  TEST  HST 11/2012  >AHI 33.    04/03/2016 Follow up ; OSA  Patient returns for a follow-up for sleep apnea. Patient is on C Pap at bedtime. Patient was last seen in office in 2016.  Patient says that he has ran out of supplies has not been able to wear his mask recently. Pt says when he does wear it , he feels refreshed . More energy . Gets sleepy if he does not wear it.  Download shows poor usage .   No Known Allergies  Immunization History  Administered Date(s) Administered  . Td 02/24/2008    Past Medical History:  Diagnosis Date  . Atypical chest pain    negative stress and 2 D echo 2006  . Erectile dysfunction   . Herpes genitalia   . Hyperlipidemia   . Low testosterone    normal iron, LH, FSH and estradiol    Tobacco History: History  Smoking Status  . Never Smoker  Smokeless Tobacco  . Never Used   Counseling given: Not Answered   Outpatient Encounter Prescriptions as of 04/03/2016  Medication Sig  . aspirin EC 81 MG tablet Take 81 mg by mouth daily.  . fluticasone (FLONASE) 50 MCG/ACT nasal spray USE TWO SPRAYS IN EACH NOSTRIL DAILY  . ketoconazole (NIZORAL) 2 % shampoo Apply topically as needed. Apply daily for 3 days   No facility-administered encounter medications on file as of 04/03/2016.      Review of Systems  Constitutional:   No  weight loss, night sweats,  Fevers, chills, fatigue, or  lassitude.  HEENT:   No headaches,  Difficulty swallowing,  Tooth/dental problems, or  Sore throat,                No sneezing, itching, ear ache, nasal congestion, post nasal drip,   CV:  No chest pain,  Orthopnea, PND, swelling in lower extremities, anasarca, dizziness, palpitations, syncope.   GI  No  heartburn, indigestion, abdominal pain, nausea, vomiting, diarrhea, change in bowel habits, loss of appetite, bloody stools.   Resp: No shortness of breath with exertion or at rest.  No excess mucus, no productive cough,  No non-productive cough,  No coughing up of blood.  No change in color of mucus.  No wheezing.  No chest wall deformity  Skin: no rash or lesions.  GU: no dysuria, change in color of urine, no urgency or frequency.  No flank pain, no hematuria   MS:  No joint pain or swelling.  No decreased range of motion.  No back pain.    Physical Exam  BP 116/78 (BP Location: Left Arm, Cuff Size: Large)   Pulse 80   Ht 6\' 2"  (1.88 m)   Wt 261 lb 12.8 oz (118.8 kg)   SpO2 98%   BMI 33.61 kg/m   GEN: A/Ox3; pleasant , NAD, well nourished    HEENT:  [place]/AT,  EACs-clear, TMs-wnl, NOSE-clear, THROAT-clear, no lesions, no postnasal drip or exudate noted. Class 3 MP airway   NECK:  Supple w/ fair ROM; no JVD; normal carotid impulses w/o bruits; no thyromegaly or nodules palpated; no lymphadenopathy.    RESP  Clear  P & A;  w/o, wheezes/ rales/ or rhonchi. no accessory muscle use, no dullness to percussion  CARD:  RRR, no m/r/g, no peripheral edema, pulses intact, no cyanosis or clubbing.  GI:   Soft & nt; nml bowel sounds; no organomegaly or masses detected.   Musco: Warm bil, no deformities or joint swelling noted.   Neuro: alert, no focal deficits noted.    Skin: Warm, no lesions or rashes    Lab Results:   BNP No results found for: BNP  ProBNP No results found for: PROBNP  Imaging: No results found.   Assessment & Plan:   No problem-specific Assessment & Plan notes found for this encounter.     Rexene Edison, NP 04/03/2016

## 2016-04-04 ENCOUNTER — Telehealth: Payer: Self-pay | Admitting: Adult Health

## 2016-04-05 NOTE — Telephone Encounter (Signed)
Order printed and sent to [facility]

## 2016-04-08 NOTE — Progress Notes (Signed)
I have reviewed and agree with assessment/plan.  Chesley Mires, MD [facility] Pulmonary/Critical Care 04/08/2016, 11:38 AM Pager:  [phone]

## 2016-06-05 DIAGNOSIS — R2242 Localized swelling, mass and lump, left lower limb: Secondary | ICD-10-CM

## 2016-06-05 HISTORY — DX: Localized swelling, mass and lump, left lower limb: R22.42

## 2016-10-05 ENCOUNTER — Ambulatory Visit: Payer: 59 | Admitting: Pulmonary Disease

## 2016-10-08 ENCOUNTER — Ambulatory Visit (INDEPENDENT_AMBULATORY_CARE_PROVIDER_SITE_OTHER): Payer: 59 | Admitting: Family Medicine

## 2016-10-08 ENCOUNTER — Encounter: Payer: Self-pay | Admitting: Family Medicine

## 2016-10-08 VITALS — BP 108/78 | HR 75 | Temp 98.9°F | Ht 73.0 in | Wt 267.2 lb

## 2016-10-08 DIAGNOSIS — K219 Gastro-esophageal reflux disease without esophagitis: Secondary | ICD-10-CM

## 2016-10-08 DIAGNOSIS — R0789 Other chest pain: Secondary | ICD-10-CM | POA: Diagnosis not present

## 2016-10-08 DIAGNOSIS — M79652 Pain in left thigh: Secondary | ICD-10-CM

## 2016-10-08 MED ORDER — RANITIDINE HCL 150 MG PO TABS: 150.0000 mg | ORAL_TABLET | Freq: Two times a day (BID) | ORAL | 2 refills | Status: DC

## 2016-10-08 NOTE — Progress Notes (Signed)
Chief Complaint  Patient presents with  . Establish Care       New Patient Visit SUBJECTIVE: HPI: Adrian Schmidt is an 51 y.o.male who is being seen for establishing care.  The patient was previously seen at [facility]. He was not comfy with specialists he was referred to [facility]) or the thoroughness of the PCP. He used to see Dr. Shawna Orleans here.  The patient has a two-year history of a knot of the left lateral portion of the knee. He had an x-ray and ultrasound done at his previous PCP's office. He is also saw an orthopedic surgeon. He was not pleased with orthopedic surgeon. He was told that he may need an MRI. It will intermittently cause him pain. There are no skin changes or drainage from the area. It is not changing in size. It is not currently hurting him. There was no injury or change in activity that initiated this process.   He also has concerns about his heart health. He has a friend who recently died from a heart attack. He has a history of atypical chest pain and had a negative stress test done in 2006. He has intermittent left-sided chest pain that is not associated with exertion. Described as more of a cramping sensation. No associated shortness of breath, jaw pain, arm pain, or vision changes.  He also has a history of [place] a Schatzki's ring requiring dilation. When he eats late at night and then goes to bed, he will experience mild regurgitation his mouth and burning pain. He has been using Tums with minimal relief. He has never tried any other antireflux medications. He is wondering if this is related to his chest pain.  No Known Allergies  Past Medical History:  Diagnosis Date  . Atypical chest pain    negative stress and 2 D echo 2006  . Erectile dysfunction   . Herpes genitalia   . Hyperlipidemia   . Low testosterone    normal iron, LH, FSH and estradiol   Past Surgical History:  Procedure Laterality Date  . SMALL INTESTINE SURGERY     stretching of small  intestine-- ring removal. Dr. Diona Fanti   Social History   Social History  . Marital status: Single   Occupational History  . EXPIDETOR [employer]   Social History Main Topics  . Smoking status: Never Smoker  . Smokeless tobacco: Never Used  . Alcohol use Yes     Comment: socially  . Drug use: No   Social History Narrative   Regular Exercise:  yes   Family History  Problem Relation Age of Onset  . Cancer Maternal Grandfather        prostate and colon  . Diabetes Maternal Grandmother   . Heart disease Maternal Grandmother        CAD     Current Outpatient Prescriptions:  .  aspirin EC 81 MG tablet, Take 81 mg by mouth daily., Disp: , Rfl:  .  ranitidine (ZANTAC 150 MAXIMUM STRENGTH) 150 MG tablet, Take 1 tablet (150 mg total) by mouth 2 (two) times daily., Disp: 30 tablet, Rfl: 2  ROS Cardiovascular: Denies current chest pain  Respiratory: Denies dyspnea   OBJECTIVE: BP 108/78 (BP Location: Left Arm, Patient Position: Sitting, Cuff Size: Large)   Pulse 75   Temp 98.9 F (37.2 C) (Oral)   Ht 6\' 1"  (1.854 m)   Wt 267 lb 4 oz (121.2 kg)   SpO2 95%   BMI 35.26 kg/m  Constitutional: -  VS reviewed -  Well developed, well nourished, appears stated age -  No apparent distress  Psychiatric: -  Oriented to person, place, and time -  Memory intact -  Affect and mood normal -  Fluent conversation, good eye contact -  Judgment and insight age appropriate  Eye: -  Conjunctivae clear, no discharge -  Pupils symmetric, round, reactive to light  ENMT: -  MMM    Pharynx moist, no exudate, no erythema  Cardiovascular: -  RRR, no murmurs or bruits -  No LE edema  Respiratory: -  Normal respiratory effort, no accessory muscle use, no retraction -  Breath sounds equal, no wheezes, no ronchi, no crackles  Gastrointestinal: -  Bowel sounds normal -  No tenderness, no distention, no guarding, no masses -  +Diastases recti   Musculoskeletal: -  Chest pain is not  reproducible to palpation  -  Small nodule/mass felt over the distal lateral hamstring. It is mildly tender to palpation. There is no fluctuance, erythema, warmth, drainage, or motor deficit.   Skin: -  No significant lesion on inspection -  Warm and dry to palpation   ASSESSMENT/PLAN: Pain of left thigh  Atypical chest pain  Gastroesophageal reflux disease, esophagitis presence not specified - Plan: ranitidine (ZANTAC 150 MAXIMUM STRENGTH) 150 MG tablet  Patient instructed to sign release of records form from his previous PCP. I would like to see when his last laboratory studies were done as well as his recent imaging. We may refer him to another orthopedic surgeon or possibly order an MRI. I did reassure him that this does not sound concerning based off of his physical exam. I also reassured him regarding his chest pain that it does not sound cardiac. I would like to see his previous records and can look further into stratifying his risk including lab work, cardiac CT versus referral to cardiology. Try H2 blocker, elevate head of bed, weight loss. Patient should return 4 weeks for med check. The patient voiced understanding and agreement to the plan.   [place], DO 10/08/16  1:14 PM

## 2016-10-08 NOTE — Progress Notes (Signed)
Pre visit review using our clinic review tool, if applicable. No additional management support is needed unless otherwise documented below in the visit note. 

## 2016-10-08 NOTE — Patient Instructions (Addendum)
The only lifestyle changes that have data behind them are weight loss for the overweight/obese and elevating the head of the bed. Finding out which foods/positions are triggers is important.  Your chest pain does not sound related to your heart.  Excellent work getting back in the gym.   Let us know if you need anything.

## 2016-11-05 ENCOUNTER — Encounter: Payer: Self-pay | Admitting: Family Medicine

## 2016-11-05 ENCOUNTER — Ambulatory Visit (INDEPENDENT_AMBULATORY_CARE_PROVIDER_SITE_OTHER): Payer: 59 | Admitting: Family Medicine

## 2016-11-05 VITALS — BP 118/72 | HR 68 | Temp 98.9°F | Ht 74.0 in | Wt 269.5 lb

## 2016-11-05 DIAGNOSIS — G8929 Other chronic pain: Secondary | ICD-10-CM

## 2016-11-05 DIAGNOSIS — M25562 Pain in left knee: Secondary | ICD-10-CM | POA: Diagnosis not present

## 2016-11-05 DIAGNOSIS — K219 Gastro-esophageal reflux disease without esophagitis: Secondary | ICD-10-CM

## 2016-11-05 NOTE — Patient Instructions (Signed)
If you do not hear anything about your referral in the next 1-2 weeks, call our office and ask for an update.   Keep up the good work with exercising and cleaning up your diet.  Let us know if you need anything.

## 2016-11-05 NOTE — Progress Notes (Signed)
Chief Complaint  Patient presents with  . Follow-up    4 week followup    Subjective: Patient is a 51 y.o. male here for f/u.  Continued L knee pain, has improved some. This is a chronic issue. He is still able to work out. He would like to see a specialist as this has been going on for a while.   Dx'd with atypical chest pain that got better with Zantac. Tolerating well. Affordable.   ROS: GI: No N/V MSK: +L lower ext pain  Family History  Problem Relation Age of Onset  . Cancer Maternal Grandfather        prostate and colon  . Diabetes Maternal Grandmother   . Heart disease Maternal Grandmother        CAD   Past Medical History:  Diagnosis Date  . Atypical chest pain    negative stress and 2 D echo 2006  . Erectile dysfunction   . Herpes genitalia   . Hyperlipidemia   . Low testosterone    normal iron, LH, FSH and estradiol   No Known Allergies  Current Outpatient Medications:  .  aspirin EC 81 MG tablet, Take 81 mg by mouth daily., Disp: , Rfl:  .  ranitidine (ZANTAC 150 MAXIMUM STRENGTH) 150 MG tablet, Take 1 tablet (150 mg total) by mouth 2 (two) times daily., Disp: 30 tablet, Rfl: 2  Objective: BP 118/72 (BP Location: Left Arm, Patient Position: Sitting, Cuff Size: Large)   Pulse 68   Temp 98.9 F (37.2 C) (Oral)   Ht 6\' 2"  (1.88 m)   Wt 269 lb 8 oz (122.2 kg)   SpO2 96%   BMI 34.60 kg/m  General: Awake, appears stated age HEENT: MMM, EOMi Heart: RRR, no bruits   Lungs: CTAB, no rales, wheezes or rhonchi. No accessory muscle use Abd: BS+, soft, NT, ND, no masses or organomegaly Psych: Age appropriate judgment and insight, normal affect and mood  Assessment and Plan: Gastroesophageal reflux disease, esophagitis presence not specified  Chronic pain of left knee - Plan: Ambulatory referral to Sports Medicine  Orders as above. No changes with reflux management. Refer to sports med for LLE issue.  Fu with me in 6 mo for CPE.  The patient voiced  understanding and agreement to the plan.  [place], DO 11/05/16  10:00 AM

## 2016-11-05 NOTE — Progress Notes (Signed)
Pre visit review using our clinic review tool, if applicable. No additional management support is needed unless otherwise documented below in the visit note. 

## 2016-11-14 ENCOUNTER — Ambulatory Visit (INDEPENDENT_AMBULATORY_CARE_PROVIDER_SITE_OTHER): Payer: 59 | Admitting: Family Medicine

## 2016-11-14 ENCOUNTER — Encounter: Payer: Self-pay | Admitting: Family Medicine

## 2016-11-14 DIAGNOSIS — M25562 Pain in left knee: Secondary | ICD-10-CM | POA: Diagnosis not present

## 2016-11-14 DIAGNOSIS — S86819A Strain of other muscle(s) and tendon(s) at lower leg level, unspecified leg, initial encounter: Secondary | ICD-10-CM

## 2016-11-14 DIAGNOSIS — G8929 Other chronic pain: Secondary | ICD-10-CM | POA: Diagnosis not present

## 2016-11-14 NOTE — Patient Instructions (Signed)
Your knee exam is very reassuring. The small nodule is a branch of an artery and nothing to be concerned about. I would just follow this - follow up with me in 6 months and we'll reassess, repeat the ultrasound.  For your calf I would take it easy the next 2 weeks. Consider compression sleeve. Do single leg calf raises 3 sets of 10 every other day for next 2 weeks. Icing first 3 days then switch to head 3-4 times a day 15 minutes at a time. Aleve, ibuprofen only if needed. Follow up with me as needed for this.

## 2016-11-15 ENCOUNTER — Encounter: Payer: Self-pay | Admitting: Family Medicine

## 2016-11-15 DIAGNOSIS — S86819A Strain of other muscle(s) and tendon(s) at lower leg level, unspecified leg, initial encounter: Secondary | ICD-10-CM

## 2016-11-15 HISTORY — DX: Strain of other muscle(s) and tendon(s) at lower leg level, unspecified leg, initial encounter: S86.819A

## 2016-11-15 NOTE — Assessment & Plan Note (Signed)
area in question is a vascular structure, arterial.  Reassured patient - no evidence of malignancy.  Advised following this in 6 months with repeat evaluation and ultrasound.

## 2016-11-15 NOTE — Assessment & Plan Note (Signed)
mild, Grade 1.  Shown home exercises and how to advance these.  Aleve or ibuprofen if needed.  Advised to take it easy for next 2 weeks.  F/u prn.

## 2016-11-15 NOTE — Progress Notes (Signed)
PCP and consultation requested by: Shelda Pal, DO  Subjective:   HPI: Patient is a 51 y.o. male here for left knee pain.  Patient reports he's had over 1 year of lateral left knee pain and localized swelling. Feels a knot here that is sore at 4/10 level with palpation. Still able to work out and do everything without increase in pain. He reports having doppler u/s of this which was reassuring and x-rays of his knee. Also reports working out yesterday he felt a sharp pain and snap in his medial left calf. Pain up to 4/10 but much better today than it was yesterday. Not taking any medicines for this. No skin changes, numbness, bruising, swelling.  Past Medical History:  Diagnosis Date  . Atypical chest pain    negative stress and 2 D echo 2006  . Erectile dysfunction   . Herpes genitalia   . Hyperlipidemia   . Low testosterone    normal iron, LH, FSH and estradiol    Current Outpatient Medications on File Prior to Visit  Medication Sig Dispense Refill  . aspirin EC 81 MG tablet Take 81 mg by mouth daily.    . ranitidine (ZANTAC 150 MAXIMUM STRENGTH) 150 MG tablet Take 1 tablet (150 mg total) by mouth 2 (two) times daily. 30 tablet 2   No current facility-administered medications on file prior to visit.     Past Surgical History:  Procedure Laterality Date  . SMALL INTESTINE SURGERY     stretching of small intestine-- ring removal. Dr. Diona Fanti    No Known Allergies  Social History   Socioeconomic History  . Marital status: Single    Spouse name: Not on file  . Number of children: 2  . Years of education: Not on file  . Highest education level: Not on file  Social Needs  . Financial resource strain: Not on file  . Food insecurity - worry: Not on file  . Food insecurity - inability: Not on file  . Transportation needs - medical: Not on file  . Transportation needs - non-medical: Not on file  Occupational History  . Occupation: Brewing technologist: [employer]  Tobacco Use  . Smoking status: Never Smoker  . Smokeless tobacco: Never Used  Substance and Sexual Activity  . Alcohol use: Yes    Comment: socially  . Drug use: No  . Sexual activity: Not on file  Other Topics Concern  . Not on file  Social History Narrative   Regular Exercise:  yes    Family History  Problem Relation Age of Onset  . Cancer Maternal Grandfather        prostate and colon  . Diabetes Maternal Grandmother   . Heart disease Maternal Grandmother        CAD    Pulse 71   Ht 6\' 2"  (1.88 m)   Wt 258 lb (117 kg)   BMI 33.13 kg/m   Review of Systems: See HPI above.     Objective:  Physical Exam:  Gen: NAD, comfortable in exam room  Left knee: No gross deformity, ecchymoses, effusion.  Small palpable nodule ~70mm in diameter, mobile laterally just posterior to distal IT band. Minimal tenderness of this nodule.  No joint line or other tenderness of the knee.  Mild tenderness of medial gastroc but without deformity. FROM with 5/5 strength flexion and extension.  Able to do single leg calf raise with mild pain. Negative ant/post drawers. Negative valgus/varus testing. Negative lachmanns.  Negative mcmurrays, apleys, patellar apprehension. NV intact distally.  Right knee: No gross deformity, ecchymoses, swelling. No TTP. FROM with full strength. Negative ant/post drawers. Negative valgus/varus testing. NV intact distally.   MSK u/s:  Nodule is anechoic - with doppler noted entire anechoic area is a vascular structure consistent with branch of artery.  No solid component.  Assessment & Plan:  1. Left knee pain - area in question is a vascular structure, arterial.  Reassured patient - no evidence of malignancy.  Advised following this in 6 months with repeat evaluation and ultrasound.  2. Left calf strain - mild, Grade 1.  Shown home exercises and how to advance these.  Aleve or ibuprofen if needed.  Advised to take it easy for next 2  weeks.  F/u prn.

## 2017-05-06 ENCOUNTER — Encounter: Payer: 59 | Admitting: Family Medicine

## 2017-05-06 DIAGNOSIS — Z0289 Encounter for other administrative examinations: Secondary | ICD-10-CM

## 2017-08-28 ENCOUNTER — Ambulatory Visit (INDEPENDENT_AMBULATORY_CARE_PROVIDER_SITE_OTHER): Payer: 59 | Admitting: Family Medicine

## 2017-08-28 ENCOUNTER — Encounter: Payer: Self-pay | Admitting: Family Medicine

## 2017-08-28 VITALS — BP 120/80 | HR 77 | Temp 98.2°F | Ht 74.0 in | Wt 256.0 lb

## 2017-08-28 DIAGNOSIS — R2242 Localized swelling, mass and lump, left lower limb: Secondary | ICD-10-CM

## 2017-08-28 DIAGNOSIS — Z1211 Encounter for screening for malignant neoplasm of colon: Secondary | ICD-10-CM

## 2017-08-28 DIAGNOSIS — R195 Other fecal abnormalities: Secondary | ICD-10-CM | POA: Diagnosis not present

## 2017-08-28 DIAGNOSIS — M25862 Other specified joint disorders, left knee: Secondary | ICD-10-CM | POA: Diagnosis not present

## 2017-08-28 DIAGNOSIS — S99911A Unspecified injury of right ankle, initial encounter: Secondary | ICD-10-CM | POA: Diagnosis not present

## 2017-08-28 DIAGNOSIS — Z125 Encounter for screening for malignant neoplasm of prostate: Secondary | ICD-10-CM | POA: Insufficient documentation

## 2017-08-28 HISTORY — DX: Encounter for screening for malignant neoplasm of prostate: Z12.5

## 2017-08-28 LAB — CBC
HEMATOCRIT: 42.3 % (ref 39.0–52.0)
HEMOGLOBIN: 14.5 g/dL (ref 13.0–17.0)
MCHC: 34.2 g/dL (ref 30.0–36.0)
MCV: 88.7 fl (ref 78.0–100.0)
Platelets: 257 10*3/uL (ref 150.0–400.0)
RBC: 4.77 Mil/uL (ref 4.22–5.81)
RDW: 13.6 % (ref 11.5–15.5)
WBC: 7.3 10*3/uL (ref 4.0–10.5)

## 2017-08-28 LAB — COMPREHENSIVE METABOLIC PANEL
ALK PHOS: 65 U/L (ref 39–117)
ALT: 24 U/L (ref 0–53)
AST: 16 U/L (ref 0–37)
Albumin: 4.3 g/dL (ref 3.5–5.2)
BUN: 14 mg/dL (ref 6–23)
CO2: 29 mEq/L (ref 19–32)
Calcium: 9.7 mg/dL (ref 8.4–10.5)
Chloride: 103 mEq/L (ref 96–112)
Creatinine, Ser: 1.19 mg/dL (ref 0.40–1.50)
GFR: 83.02 mL/min (ref >60.00)
GLUCOSE: 90 mg/dL (ref 70–99)
Potassium: 4.1 mEq/L (ref 3.5–5.1)
SODIUM: 139 meq/L (ref 135–145)
TOTAL PROTEIN: 7.4 g/dL (ref 6.0–8.3)
Total Bilirubin: 0.6 mg/dL (ref 0.2–1.2)

## 2017-08-28 MED ORDER — NAPROXEN 500 MG PO TBEC: 500.0000 mg | DELAYED_RELEASE_TABLET | Freq: Two times a day (BID) | ORAL | 0 refills | Status: DC

## 2017-08-28 MED ORDER — KETOCONAZOLE 2 % EX SHAM: MEDICATED_SHAMPOO | Freq: Every day | CUTANEOUS | 3 refills | Status: DC

## 2017-08-28 NOTE — Patient Instructions (Addendum)
If you do not hear anything about your referral in the next 1-2 weeks, call our office and ask for an update.  Keep drinking Gatorade.  Ice/cold pack over area for 10-15 min twice daily.  OK to take Tylenol 1000 mg (2 extra strength tabs) or 975 mg (3 regular strength tabs) every 6 hours as needed.  Ankle Exercises It is normal to feel mild stretching, pulling, tightness, or discomfort as you do these exercises, but you should stop right away if you feel sudden pain or your pain gets worse.  Stretching and range of motion exercises These exercises warm up your muscles and joints and improve the movement and flexibility of your ankle. These exercises also help to relieve pain, numbness, and tingling. Exercise A: Dorsiflexion/Plantar Flexion   1. Sit with your affected knee straight or bent. Do not rest your foot on anything. 2. Flex your affected ankle to tilt the top of your foot toward your shin. 3. Hold this position for 5 seconds. 4. Point your toes downward to tilt the top of your foot away from your shin. 5. Hold this position for 5 seconds. Repeat 2 times. Complete this exercise 3 times per week. Exercise B: Ankle Alphabet   1. Sit with your affected foot supported at your lower leg. ? Do not rest your foot on anything. ? Make sure your foot has room to move freely. 2. Think of your affected foot as a paintbrush, and move your foot to trace each letter of the alphabet in the air. Keep your hip and knee still while you trace. Make the letters as large as you can without increasing any discomfort. 3. Trace every letter from A to Z. Repeat 2 times. Complete this exercise 3 times per week. Strengthening exercises These exercises build strength and endurance in your ankle. Endurance is the ability to use your muscles for a long time, even after they get tired. Exercise D: Dorsiflexors   1. Secure a rubber exercise band or tube to an object, such as a table leg, that will stay  still when the band is pulled. Secure the other end around your affected foot. 2. Sit on the floor, facing the object with your affected leg extended. The band or tube should be slightly tense when your foot is relaxed. 3. Slowly flex your affected ankle and toes to bring your foot toward you. 4. Hold this position for 3 seconds.  5. Slowly return your foot to the starting position, controlling the band as you do that. Do a total of 10 repetitions. Repeat 2 times. Complete this exercise 3 times per week. Exercise E: Plantar Flexors   1. Sit on the floor with your affected leg extended. 2. Loop a rubber exercise band or tube around the ball of your affected foot. The ball of your foot is on the walking surface, right under your toes. The band or tube should be slightly tense when your foot is relaxed. 3. Slowly point your toes downward, pushing them away from you. 4. Hold this position for 3 seconds. 5. Slowly release the tension in the band or tube, controlling smoothly until your foot is back in the starting position. Repeat for a total of 10 repetitions. Repeat 2 times. Complete this exercise 3 times per week. Exercise F: Towel Curls   1. Sit in a chair on a non-carpeted surface, and put your feet on the floor. 2. Place a towel in front of your feet.  3. Keeping your heel on  the floor, put your affected foot on the towel. 4. Pull the towel toward you by grabbing the towel with your toes and curling them under. Keep your heel on the floor. 5. Let your toes relax. 6. Grab the towel again. Keep going until the towel is completely underneath your foot. Repeat for a total of 10 repetitions. Repeat 2 times. Complete this exercise 3 times per week. Exercise G: Heel Raise ( Plantar Flexors, Standing)    1. Stand with your feet shoulder-width apart. 2. Keep your weight spread evenly over the width of your feet while you rise up on your toes. Use a wall or table to steady yourself, but try  not to use it for support. 3. If this exercise is too easy, try these options: ? Shift your weight toward your affected leg until you feel challenged. ? If told by your health care provider, lift your uninjured leg off the floor. 4. Hold this position for 3 seconds. Repeat for a total of 10 repetitions. Repeat 2 times. Complete this exercise 3 times per week. Exercise H: Tandem Walking 1. Stand with one foot directly in front of the other. 2. Slowly raise your back foot up, lifting your heel before your toes, and place it directly in front of your other foot. 3. Continue to walk in this heel-to-toe way for 10 steps or for as long as told by your health care provider. Have a countertop or wall nearby to use if needed to keep your balance, but try not to hold onto anything for support. Repeat 2 times. Complete this exercises 3 times per week. Make sure you discuss any questions you have with your health care provider. Document Released: 11/01/2004 Document Revised: 08/18/2015 Document Reviewed: 09/05/2014 Elsevier Interactive Patient Education  2018 [employer].

## 2017-08-28 NOTE — Progress Notes (Signed)
Chief Complaint  Patient presents with  . Follow-up    Subjective: Patient is a 52 y.o. male here for f/u L knee mass.  2 yrs has been dealing with mass on L lat knee issue. Saw sports med and US showed possible vascular structure. Has not followed up yet. It is painful and not improving. He would like it removed.   1-2 weeks ago hurt L ankle while doing calf raises at gym. Feeling better, still a little swollen and tender. No neurologic s/s's.  Would like to be screened for colon cancer. Has been having loose and freq stools over past mo. No abd pain, some gurgling. Denies bleeding, fevers, wt loss, nighttime awakenings.   ROS: MSK: +ankle pain GI: See above  Past Medical History:  Diagnosis Date  . Atypical chest pain    negative stress and 2 D echo 2006  . Erectile dysfunction   . Herpes genitalia   . Hyperlipidemia   . Low testosterone    normal iron, LH, FSH and estradiol    Objective: BP 120/80 (BP Location: Left Arm, Patient Position: Sitting, Cuff Size: Large)   Pulse 77   Temp 98.2 F (36.8 C) (Oral)   Ht 6\' 2"  (1.88 m)   Wt 256 lb (116.1 kg)   SpO2 97%   BMI 32.87 kg/m  General: Awake, appears stated age HEENT: MMM, EOMi Heart: RRR, no LE edema Lungs: CTAB, no rales, wheezes or rhonchi. No accessory muscle use Abd: BS+, soft, nt, nd, no masses or organomegaly Psych: Age appropriate judgment and insight, normal affect and mood  Assessment and Plan: Knee mass, left  Screen for colon cancer - Plan: Ambulatory referral to Gastroenterology  Loose stools - Plan: CBC, Comprehensive metabolic panel, Stool Culture, Ova and parasite examination, Tissue transglutaminase, IgA, Reticulin Antibody, IgA w reflex titer  Ankle injury, right, initial encounter - Plan: naproxen (EC NAPROSYN) 500 MG EC tablet  Orders as above. Will check with Dr. Barbaraann Barthel to see if he wishes to f/u with pt or have him see a surg specialist. Ck above for stools. Food diary. Needs referral  to Gi for ccs anyways. Nsaids, stretches/exercises, Tylenol, activity as tolerated. F/u in 2 mo for CPE.  The patient voiced understanding and agreement to the plan.  [place], DO 08/28/17  9:23 AM

## 2017-08-28 NOTE — Progress Notes (Signed)
Pre visit review using our clinic review tool, if applicable. No additional management support is needed unless otherwise documented below in the visit note. 

## 2017-08-29 ENCOUNTER — Other Ambulatory Visit: Payer: 59

## 2017-08-29 DIAGNOSIS — R195 Other fecal abnormalities: Secondary | ICD-10-CM

## 2017-08-30 LAB — TISSUE TRANSGLUTAMINASE, IGA: (TTG) AB, IGA: 1 U/mL

## 2017-08-30 LAB — RETICULIN ANTIBODIES, IGA W TITER: Reticulin IGA Screen: NEGATIVE

## 2017-09-03 LAB — STOOL CULTURE
MICRO NUMBER: 91035461
MICRO NUMBER:: 91035462
MICRO NUMBER:: 91035464
SHIGA RESULT: NOT DETECTED
SPECIMEN QUALITY: ADEQUATE
SPECIMEN QUALITY: ADEQUATE
SPECIMEN QUALITY: ADEQUATE

## 2017-09-03 LAB — OVA AND PARASITE EXAMINATION
CONCENTRATE RESULT:: NONE SEEN
SPECIMEN QUALITY: ADEQUATE
TRICHROME RESULT:: NONE SEEN
VKL: 91035463

## 2017-09-30 ENCOUNTER — Encounter: Payer: Self-pay | Admitting: Gastroenterology

## 2017-10-03 ENCOUNTER — Encounter: Payer: Self-pay | Admitting: Family Medicine

## 2017-10-03 ENCOUNTER — Ambulatory Visit (INDEPENDENT_AMBULATORY_CARE_PROVIDER_SITE_OTHER): Payer: 59 | Admitting: Family Medicine

## 2017-10-03 VITALS — BP 120/77 | HR 97 | Ht 73.0 in | Wt 265.0 lb

## 2017-10-03 DIAGNOSIS — R2242 Localized swelling, mass and lump, left lower limb: Secondary | ICD-10-CM | POA: Diagnosis not present

## 2017-10-03 NOTE — Progress Notes (Signed)
PCP and consultation requested by: Shelda Pal, DO  Subjective:   HPI: Patient is a 52 y.o. male here for left knee pain.  11/14/16: Patient reports he's had over 1 year of lateral left knee pain and localized swelling. Feels a knot here that is sore at 4/10 level with palpation. Still able to work out and do everything without increase in pain. He reports having doppler u/s of this which was reassuring and x-rays of his knee. Also reports working out yesterday he felt a sharp pain and snap in his medial left calf. Pain up to 4/10 but much better today than it was yesterday. Not taking any medicines for this. No skin changes, numbness, bruising, swelling.  10/03/17: Patient reports he's still having issues with the localized swollen area lateral left knee.   Pain currently 0/10 but can be sharp, sore with palpation or rubbing against this area. No limitation on his working out. Has not grown in size - about the size of a raisin. No skin changes, numbness.  Past Medical History:  Diagnosis Date  . Atypical chest pain    negative stress and 2 D echo 2006  . Erectile dysfunction   . Herpes genitalia   . Hyperlipidemia   . Low testosterone    normal iron, LH, FSH and estradiol    Current Outpatient Medications on File Prior to Visit  Medication Sig Dispense Refill  . aspirin EC 81 MG tablet Take 81 mg by mouth daily.    Marland Kitchen ketoconazole (NIZORAL) 2 % shampoo Apply topically daily. Apply daily for 3 days 120 mL 3  . naproxen (EC NAPROSYN) 500 MG EC tablet Take 1 tablet (500 mg total) by mouth 2 (two) times daily with a meal. 30 tablet 0  . ranitidine (ZANTAC 150 MAXIMUM STRENGTH) 150 MG tablet Take 1 tablet (150 mg total) by mouth 2 (two) times daily. 30 tablet 2   No current facility-administered medications on file prior to visit.     Past Surgical History:  Procedure Laterality Date  . SMALL INTESTINE SURGERY     stretching of small intestine-- ring removal. Dr.  Diona Fanti    No Known Allergies  Social History   Socioeconomic History  . Marital status: Single    Spouse name: Not on file  . Number of children: 2  . Years of education: Not on file  . Highest education level: Not on file  Occupational History  . Occupation: Futures trader: [employer]  Social Needs  . Financial resource strain: Not on file  . Food insecurity:    Worry: Not on file    Inability: Not on file  . Transportation needs:    Medical: Not on file    Non-medical: Not on file  Tobacco Use  . Smoking status: Never Smoker  . Smokeless tobacco: Never Used  Substance and Sexual Activity  . Alcohol use: Yes    Comment: socially  . Drug use: No  . Sexual activity: Not on file  Lifestyle  . Physical activity:    Days per week: Not on file    Minutes per session: Not on file  . Stress: Not on file  Relationships  . Social connections:    Talks on phone: Not on file    Gets together: Not on file    Attends religious service: Not on file    Active member of club or organization: Not on file    Attends meetings of clubs or  organizations: Not on file    Relationship status: Not on file  . Intimate partner violence:    Fear of current or ex partner: Not on file    Emotionally abused: Not on file    Physically abused: Not on file    Forced sexual activity: Not on file  Other Topics Concern  . Not on file  Social History Narrative   Regular Exercise:  yes    Family History  Problem Relation Age of Onset  . Cancer Maternal Grandfather        prostate and colon  . Diabetes Maternal Grandmother   . Heart disease Maternal Grandmother        CAD    BP 120/77   Pulse 97   Ht 6\' 1"  (1.854 m)   Wt 265 lb (120.2 kg)   BMI 34.96 kg/m   Review of Systems: See HPI above.     Objective:  Physical Exam:  Gen: NAD, comfortable in exam room  Left knee: No gross deformity, ecchymoses, swelling.  Small superficial mobile palpable nodule lateral  knee between IT band and lateral hamstring tendon. Mild TTP over nodule. FROM with 5/5 strength flexion and extension. Negative ant/post drawers. Negative valgus/varus testing. Negative lachmans. Negative mcmurrays, apleys. NV intact distally.  MSK u/s:  Nodule anechoic, measures 0.3 x 0.18cm and is still noted to be a wholly vascular structure consistent with arterial branch, dilatation, or small aneurysm.  No solid component.  Assessment & Plan:  1. Left knee pain - Area in question has not grown in size since last visit and still noted to be vascular, no concerning features about this.  I advised continued conservative treatment and reassured him but he would like to see a surgeon for their opinion/recommendations.  F/u prn.

## 2017-10-03 NOTE — Addendum Note (Signed)
Addended by: Sherrie George F on: 10/03/2017 03:42 PM   Modules accepted: Orders

## 2017-10-03 NOTE — Patient Instructions (Signed)
The area of your nodule is still reassuring - consistent with branch of an artery and no cancer. We will refer you to a surgeon to discuss but I suspect they will also encourage you to leave this alone given arteries provide blood supply to parts of the body. Follow up with me as needed.

## 2017-11-01 ENCOUNTER — Ambulatory Visit (INDEPENDENT_AMBULATORY_CARE_PROVIDER_SITE_OTHER): Payer: 59 | Admitting: Family Medicine

## 2017-11-01 ENCOUNTER — Other Ambulatory Visit: Payer: Self-pay | Admitting: Family Medicine

## 2017-11-01 ENCOUNTER — Encounter: Payer: Self-pay | Admitting: Family Medicine

## 2017-11-01 VITALS — BP 112/68 | HR 69 | Temp 98.9°F | Ht 73.0 in | Wt 262.0 lb

## 2017-11-01 DIAGNOSIS — N529 Male erectile dysfunction, unspecified: Secondary | ICD-10-CM

## 2017-11-01 DIAGNOSIS — Z0001 Encounter for general adult medical examination with abnormal findings: Secondary | ICD-10-CM

## 2017-11-01 DIAGNOSIS — R0789 Other chest pain: Secondary | ICD-10-CM

## 2017-11-01 DIAGNOSIS — R5383 Other fatigue: Secondary | ICD-10-CM

## 2017-11-01 DIAGNOSIS — Z0000 Encounter for general adult medical examination without abnormal findings: Secondary | ICD-10-CM

## 2017-11-01 DIAGNOSIS — Z125 Encounter for screening for malignant neoplasm of prostate: Secondary | ICD-10-CM

## 2017-11-01 HISTORY — DX: Male erectile dysfunction, unspecified: N52.9

## 2017-11-01 HISTORY — DX: Other fatigue: R53.83

## 2017-11-01 MED ORDER — VARDENAFIL HCL 10 MG PO TABS: 10.0000 mg | ORAL_TABLET | Freq: Every day | ORAL | 0 refills | Status: DC | PRN

## 2017-11-01 MED ORDER — VARDENAFIL HCL 20 MG PO TABS: 20.0000 mg | ORAL_TABLET | Freq: Every day | ORAL | 0 refills | Status: DC | PRN

## 2017-11-01 NOTE — Progress Notes (Signed)
Chief Complaint  Patient presents with  . Follow-up    Well Male Adrian Schmidt is here for a complete physical.   His last physical was >1 year ago.  Current diet: in general, a "better but not perfect" diet.  Current exercise: walking, cycling, lifting weights Weight trend: stable Does pt snore? Yes- has CPAP Daytime fatigue? No. Seat belt? Yes.    Notes he has intermittent chest tightness and wants a full heart work up. Has not had cholesterol checked. Does not want a cath (the dye test). Does not follow cards. Not exertional, no sob. L sided tightness that comes and goes.   Has a hx of fatigue, typically better when he uses his cpap. Would like testosterone checked. Desire still present, has issues with erections. Requesting rx for Levitra.   Health maintenance Shingrix- No Colonoscopy- No - has appt coming up Tetanus- Yes HIV- Yes Prostate cancer screening- No   Past Medical History:  Diagnosis Date  . Atypical chest pain    negative stress and 2 D echo 2006  . Erectile dysfunction   . Herpes genitalia   . Hyperlipidemia   . Low testosterone    normal iron, LH, FSH and estradiol      Past Surgical History:  Procedure Laterality Date  . SMALL INTESTINE SURGERY     stretching of small intestine-- ring removal. Dr. Diona Fanti    Medications  Current Outpatient Medications on File Prior to Visit  Medication Sig Dispense Refill  . aspirin EC 81 MG tablet Take 81 mg by mouth daily.    Marland Kitchen ketoconazole (NIZORAL) 2 % shampoo Apply topically daily. Apply daily for 3 days 120 mL 3  . naproxen (EC NAPROSYN) 500 MG EC tablet Take 1 tablet (500 mg total) by mouth 2 (two) times daily with a meal. 30 tablet 0  . ranitidine (ZANTAC 150 MAXIMUM STRENGTH) 150 MG tablet Take 1 tablet (150 mg total) by mouth 2 (two) times daily. 30 tablet 2   Allergies No Known Allergies  Family History Family History  Problem Relation Age of Onset  . Cancer Maternal Grandfather    prostate and colon  . Diabetes Maternal Grandmother   . Heart disease Maternal Grandmother        CAD    Review of Systems: Constitutional:  no fevers Eye:  no recent significant change in vision Ear/Nose/Mouth/Throat:  Ears:  no hearing loss Nose/Mouth/Throat:  no complaints of nasal congestion, no sore throat Cardiovascular:  +chest tightness, no palpitations Respiratory:  no cough and no shortness of breath Gastrointestinal:  no abdominal pain, no change in bowel habits GU:  Male: negative for dysuria, frequency, and incontinence and negative for prostate symptoms Musculoskeletal/Extremities: +knee pain; otherwise no pain, redness, or swelling of the joints Integumentary (Skin/Breast):  no abnormal skin lesions reported Neurologic:  no headaches Endocrine: No unexpected weight changes Hematologic/Lymphatic:  no abnormal bleeding  Exam BP 112/68 (BP Location: Left Arm, Patient Position: Sitting, Cuff Size: Large)   Pulse 69   Temp 98.9 F (37.2 C) (Oral)   Ht 6\' 1"  (1.854 m)   Wt 262 lb (118.8 kg)   SpO2 96%   BMI 34.57 kg/m  General:  well developed, well nourished, in no apparent distress Skin:  no significant moles, warts, or growths Head:  no masses, lesions, or tenderness Eyes:  pupils equal and round, sclera anicteric without injection Ears:  canals without lesions, TMs shiny without retraction, no obvious effusion, no erythema Nose:  nares patent,  septum midline, mucosa normal Throat/Pharynx:  lips and gingiva without lesion; tongue and uvula midline; non-inflamed pharynx; no exudates or postnasal drainage Neck: neck supple without adenopathy, thyromegaly, or masses Lungs:  clear to auscultation, breath sounds equal bilaterally, no respiratory distress Cardio:  regular rate and rhythm, no LE edema, no bruits Abdomen:  abdomen soft, nontender; bowel sounds normal; no masses or organomegaly Genital (male): circumcised penis, no lesions or discharge; testes present  bilaterally without masses or tenderness Rectal: Deferred Musculoskeletal:  symmetrical muscle groups noted without atrophy or deformity Extremities:  no clubbing, cyanosis, or edema, no deformities, no skin discoloration Neuro:  gait normal; deep tendon reflexes normal and symmetric Psych: well oriented with normal range of affect and appropriate judgment/insight  Assessment and Plan  Well adult exam - Plan: Lipid panel, Comprehensive metabolic panel  Chest tightness - Plan: Ambulatory referral to Cardiology  Other fatigue - Plan: Testosterone  Screening for prostate cancer - Plan: PSA  Erectile dysfunction, unspecified erectile dysfunction type - Plan: vardenafil (LEVITRA) 10 MG tablet   Well 52 y.o. male. Counseled on diet and exercise. Counseled on risks and benefits of prostate cancer screening with PSA. The patient agrees to undergo testing. Immunizations, labs, and further orders as above. The patient's chest discomfort is atypical.  We discussed the options of coronary artery CT scan, Framingham risk calculation, versus referral to cardiology.  He opted for the referral. Come by for morning labs at earliest convenience.  Written prescription for Levitra. Follow up 1 yr. The patient voiced understanding and agreement to the plan.  [place], DO 11/01/17 3:24 PM

## 2017-11-01 NOTE — Patient Instructions (Addendum)
Give [place] 2-3 business days to get the results of your labs back.   The new Shingrix vaccine (for shingles) is a 2 shot series. It can make people feel low energy, achy and almost like they have the flu for 48 hours after injection. Please plan accordingly when deciding on when to get this shot. Call our office for a nurse visit appointment to get this. The second shot of the series is less severe regarding the side effects, but it still lasts 48 hours.   If you do not hear anything about your referral in the next 1-2 weeks, call our office and ask for an update.  Keep the diet clean and stay active.  Goodrx.com or the app can be helpful finding cheaper meds.  Let us know if you need anything.

## 2017-11-01 NOTE — Progress Notes (Signed)
Pre visit review using our clinic review tool, if applicable. No additional management support is needed unless otherwise documented below in the visit note. 

## 2017-11-04 ENCOUNTER — Telehealth: Payer: Self-pay

## 2017-11-04 ENCOUNTER — Other Ambulatory Visit: Payer: Self-pay | Admitting: Family Medicine

## 2017-11-04 ENCOUNTER — Ambulatory Visit (AMBULATORY_SURGERY_CENTER): Payer: Self-pay

## 2017-11-04 ENCOUNTER — Other Ambulatory Visit (INDEPENDENT_AMBULATORY_CARE_PROVIDER_SITE_OTHER): Payer: 59

## 2017-11-04 VITALS — Ht 73.0 in | Wt 264.4 lb

## 2017-11-04 DIAGNOSIS — R5383 Other fatigue: Secondary | ICD-10-CM

## 2017-11-04 DIAGNOSIS — Z125 Encounter for screening for malignant neoplasm of prostate: Secondary | ICD-10-CM | POA: Diagnosis not present

## 2017-11-04 DIAGNOSIS — Z1211 Encounter for screening for malignant neoplasm of colon: Secondary | ICD-10-CM

## 2017-11-04 DIAGNOSIS — Z0000 Encounter for general adult medical examination without abnormal findings: Secondary | ICD-10-CM | POA: Diagnosis not present

## 2017-11-04 DIAGNOSIS — R7989 Other specified abnormal findings of blood chemistry: Secondary | ICD-10-CM

## 2017-11-04 LAB — COMPREHENSIVE METABOLIC PANEL
ALBUMIN: 4.5 g/dL (ref 3.5–5.2)
ALK PHOS: 59 U/L (ref 39–117)
ALT: 29 U/L (ref 0–53)
AST: 23 U/L (ref 0–37)
BILIRUBIN TOTAL: 0.6 mg/dL (ref 0.2–1.2)
BUN: 15 mg/dL (ref 6–23)
CALCIUM: 9.9 mg/dL (ref 8.4–10.5)
CHLORIDE: 104 meq/L (ref 96–112)
CO2: 29 mEq/L (ref 19–32)
CREATININE: 1.22 mg/dL (ref 0.40–1.50)
GFR: 80.6 mL/min (ref >60.00)
Glucose, Bld: 97 mg/dL (ref 70–99)
Potassium: 4.1 mEq/L (ref 3.5–5.1)
SODIUM: 139 meq/L (ref 135–145)
TOTAL PROTEIN: 7.6 g/dL (ref 6.0–8.3)

## 2017-11-04 LAB — LIPID PANEL
CHOL/HDL RATIO: 6
CHOLESTEROL: 231 mg/dL — AB (ref 0–200)
HDL: 36.5 mg/dL — ABNORMAL LOW (ref >39.00)
NonHDL: 194.72
TRIGLYCERIDES: 295 mg/dL — AB (ref 0.0–149.0)
VLDL: 59 mg/dL — ABNORMAL HIGH (ref 0.0–40.0)

## 2017-11-04 LAB — TESTOSTERONE: Testosterone: 208.21 ng/dL — ABNORMAL LOW (ref 300.00–890.00)

## 2017-11-04 LAB — LDL CHOLESTEROL, DIRECT: Direct LDL: 154 mg/dL

## 2017-11-04 LAB — PSA: PSA: 0.75 ng/mL (ref 0.10–4.00)

## 2017-11-04 MED ORDER — PEG 3350-KCL-NA BICARB-NACL 420 G PO SOLR: 4000.0000 mL | Freq: Once | ORAL | 0 refills | Status: AC

## 2017-11-04 MED ORDER — ATORVASTATIN CALCIUM 40 MG PO TABS: 40.0000 mg | ORAL_TABLET | Freq: Every day | ORAL | 3 refills | Status: DC

## 2017-11-04 NOTE — Progress Notes (Signed)
Denies allergies to eggs or soy products. Denies complication of anesthesia or sedation. Denies use of weight loss medication. Denies use of O2.   Emmi instructions declined.  

## 2017-11-04 NOTE — Telephone Encounter (Signed)
PA initiated via Covermymeds; KEY: [id number]. Awaiting determination.

## 2017-11-05 ENCOUNTER — Encounter: Payer: Self-pay | Admitting: Gastroenterology

## 2017-11-05 NOTE — Telephone Encounter (Signed)
PA approved. Effective 11/04/2017 to 11/05/2018.

## 2017-11-15 ENCOUNTER — Ambulatory Visit (INDEPENDENT_AMBULATORY_CARE_PROVIDER_SITE_OTHER): Payer: 59 | Admitting: Cardiology

## 2017-11-15 ENCOUNTER — Encounter: Payer: Self-pay | Admitting: Cardiology

## 2017-11-15 VITALS — BP 116/62 | HR 68 | Ht 73.0 in | Wt 265.0 lb

## 2017-11-15 DIAGNOSIS — E782 Mixed hyperlipidemia: Secondary | ICD-10-CM

## 2017-11-15 DIAGNOSIS — R0789 Other chest pain: Secondary | ICD-10-CM

## 2017-11-15 DIAGNOSIS — G4733 Obstructive sleep apnea (adult) (pediatric): Secondary | ICD-10-CM

## 2017-11-15 HISTORY — DX: Other chest pain: R07.89

## 2017-11-15 HISTORY — DX: Mixed hyperlipidemia: E78.2

## 2017-11-15 NOTE — Addendum Note (Signed)
Addended by: Mattie Marlin on: 11/15/2017 02:41 PM   Modules accepted: Orders

## 2017-11-15 NOTE — Patient Instructions (Signed)
Medication Instructions:  Your physician recommends that you continue on your current medications as directed. Please refer to the Current Medication list given to you today.  If you need a refill on your cardiac medications before your next appointment, please call your pharmacy.   Lab work: None  If you have labs (blood work) drawn today and your tests are completely normal, you will receive your results only by: Marland Kitchen MyChart Message (if you have MyChart) OR . A paper copy in the mail If you have any lab test that is abnormal or we need to change your treatment, we will call you to review the results.  Testing/Procedures: Your physician has requested that you have a stress echocardiogram. For further information please visit [email or web address]. Please follow instruction sheet as given.  Follow-Up: At [facility], you and your health needs are our priority.  As part of our continuing mission to provide you with exceptional heart care, we have created designated Provider Care Teams.  These Care Teams include your primary Cardiologist (physician) and Advanced Practice Providers (APPs -  Physician Assistants and Nurse Practitioners) who all work together to provide you with the care you need, when you need it.  You will need a follow up appointment in 6 months.  Please call our office 2 months in advance to schedule this appointment.  You may see another member of our [employer] Provider Team in [place]: Jenne Campus, MD . Shirlee More, MD  Any Other Special Instructions Will Be Listed Below (If Applicable).

## 2017-11-15 NOTE — Progress Notes (Signed)
Cardiology Office Note:    Date:  11/15/2017   ID:  Adrian Schmidt, DOB November 26, 1965, MRN [id number]  PCP:  Shelda Pal, DO  Cardiologist:  Jenean Lindau, MD   Referring MD: Shelda Pal*    ASSESSMENT:    1. Chest discomfort   2. Mixed dyslipidemia   3. OSA (obstructive sleep apnea)    PLAN:    In order of problems listed above:  1. Primary prevention stressed with the patient.  Importance of compliance with diet and medication stressed and he vocalized understanding.  His blood pressure is stable. 2. Diet was discussed with dyslipidemia and dietary intervention was advised and I discussed this with him at length.  He is being initiated on a statin by primary care physician and these is been followed meticulously by them.  Sleep health issues were discussed 3. The patient's chest discomfort is atypical for coronary etiology.  To reassure him and also in view of his multiple risk factors he will undergo exercise stress echo.  If this is negative he will be seen in follow-up appointment in 6 months or earlier if he has any concerns.   Medication Adjustments/Labs and Tests Ordered: Current medicines are reviewed at length with the patient today.  Concerns regarding medicines are outlined above.  No orders of the defined types were placed in this encounter.  No orders of the defined types were placed in this encounter.    History of Present Illness:    Adrian Schmidt is a 52 y.o. male who is being seen today for the evaluation of chest discomfort at the request of Shelda Pal*.  Patient is a pleasant 52 year old male.  He has past medical history of mixed dyslipidemia and sleep apnea.  He mentions to me that he occasionally has chest pains and he is concerned about it which is why he is here for evaluation.  List of hypertension or diabetes mellitus.  No family history of coronary artery disease.  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  He also lifts significant amount of weights in the gym and with this he has no problems.  At the time of my evaluation, the patient is alert awake oriented and in no distress.  Past Medical History:  Diagnosis Date  . Allergy   . Atypical chest pain    negative stress and 2 D echo 2006  . Erectile dysfunction   . GERD (gastroesophageal reflux disease)   . Herpes genitalia   . Hyperlipidemia   . Low testosterone    normal iron, LH, FSH and estradiol  . Sleep apnea    CPAP    Past Surgical History:  Procedure Laterality Date  . SMALL INTESTINE SURGERY     stretching of small intestine-- ring removal. Dr. Diona Fanti    Current Medications: Current Meds  Medication Sig  . aspirin EC 81 MG tablet Take 81 mg by mouth daily.  Marland Kitchen atorvastatin (LIPITOR) 40 MG tablet Take 1 tablet (40 mg total) by mouth daily.  Marland Kitchen esomeprazole (NEXIUM) 20 MG capsule Take 20 mg by mouth as needed.  . fluticasone (FLONASE) 50 MCG/ACT nasal spray Place 2 sprays into both nostrils as needed for allergies or rhinitis.  Marland Kitchen ketoconazole (NIZORAL) 2 % shampoo Apply topically daily. Apply daily for 3 days  . Multiple Vitamin (MULTIVITAMIN) tablet Take  1 tablet by mouth daily.  . naproxen (EC NAPROSYN) 500 MG EC tablet Take 1 tablet (500 mg total) by mouth 2 (two) times daily with a meal. (Patient taking differently: Take 500 mg by mouth as needed. )  . OVER THE COUNTER MEDICATION Bayer Body and Back 500 mg. One capsule as needed.  Marland Kitchen OVER THE COUNTER MEDICATION Hydroxy Cut Black, Dietary Supplement, one capsule three times a week.  . ranitidine (ZANTAC 150 MAXIMUM STRENGTH) 150 MG tablet Take 1 tablet (150 mg total) by mouth 2 (two) times daily.  . vardenafil (LEVITRA) 20 MG tablet Take 1 tablet (20 mg total) by mouth daily as needed for erectile dysfunction.     Allergies:   Patient has no  known allergies.   Social History   Socioeconomic History  . Marital status: Single    Spouse name: Not on file  . Number of children: 2  . Years of education: Not on file  . Highest education level: Not on file  Occupational History  . Occupation: Futures trader: [employer]  Social Needs  . Financial resource strain: Not on file  . Food insecurity:    Worry: Not on file    Inability: Not on file  . Transportation needs:    Medical: Not on file    Non-medical: Not on file  Tobacco Use  . Smoking status: Never Smoker  . Smokeless tobacco: Never Used  Substance and Sexual Activity  . Alcohol use: Yes    Comment: socially  . Drug use: No  . Sexual activity: Not on file  Lifestyle  . Physical activity:    Days per week: Not on file    Minutes per session: Not on file  . Stress: Not on file  Relationships  . Social connections:    Talks on phone: Not on file    Gets together: Not on file    Attends religious service: Not on file    Active member of club or organization: Not on file    Attends meetings of clubs or organizations: Not on file    Relationship status: Not on file  Other Topics Concern  . Not on file  Social History Narrative   Regular Exercise:  yes     Family History: The patient's family history includes Cancer in his maternal grandfather; Colon cancer in his maternal grandfather; Diabetes in his maternal grandmother; Heart disease in his maternal grandmother. There is no history of Esophageal cancer, Rectal cancer, or Stomach cancer.  ROS:   Please see the history of present illness.    All other systems reviewed and are negative.  EKGs/Labs/Other Studies Reviewed:    The following studies were reviewed today: EKG reveals sinus rhythm and nonspecific ST-T changes next   Recent Labs: 08/28/2017: Hemoglobin 14.5; Platelets 257.0 11/04/2017: ALT 29; BUN 15; Creatinine, Ser 1.22; Potassium 4.1; Sodium 139  Recent Lipid Panel      Component Value Date/Time   CHOL 231 (H) 11/04/2017 0921   TRIG 295.0 (H) 11/04/2017 0921   HDL 36.50 (L) 11/04/2017 0921   CHOLHDL 6 11/04/2017 0921   VLDL 59.0 (H) 11/04/2017 0921   LDLCALC 134 (H) 04/18/2010 1224   LDLDIRECT 154.0 11/04/2017 0921    Physical Exam:    VS:  BP 116/62 (BP Location: Right Arm, Patient Position: Sitting, Cuff Size: Normal)   Pulse 68   Ht 6\' 1"  (1.854 m)   Wt 265 lb (120.2 kg)   SpO2 99%  BMI 34.96 kg/m     Wt Readings from Last 3 Encounters:  11/15/17 265 lb (120.2 kg)  11/04/17 264 lb 6.4 oz (119.9 kg)  11/01/17 262 lb (118.8 kg)     GEN: Patient is in no acute distress HEENT: Normal NECK: No JVD; No carotid bruits LYMPHATICS: No lymphadenopathy CARDIAC: S1 S2 regular, 2/6 systolic murmur at the apex. RESPIRATORY:  Clear to auscultation without rales, wheezing or rhonchi  ABDOMEN: Soft, non-tender, non-distended MUSCULOSKELETAL:  No edema; No deformity  SKIN: Warm and dry NEUROLOGIC:  Alert and oriented x 3 PSYCHIATRIC:  Normal affect    Signed, Jenean Lindau, MD  11/15/2017 2:34 PM    [place]

## 2017-11-18 ENCOUNTER — Other Ambulatory Visit: Payer: 59

## 2017-11-19 ENCOUNTER — Ambulatory Visit (AMBULATORY_SURGERY_CENTER): Payer: 59 | Admitting: Gastroenterology

## 2017-11-19 ENCOUNTER — Encounter: Payer: Self-pay | Admitting: Gastroenterology

## 2017-11-19 VITALS — BP 128/64 | HR 70 | Temp 99.3°F | Resp 18 | Ht 73.0 in | Wt 252.0 lb

## 2017-11-19 DIAGNOSIS — Z1211 Encounter for screening for malignant neoplasm of colon: Secondary | ICD-10-CM | POA: Diagnosis not present

## 2017-11-19 DIAGNOSIS — D122 Benign neoplasm of ascending colon: Secondary | ICD-10-CM | POA: Diagnosis not present

## 2017-11-19 MED ORDER — SODIUM CHLORIDE 0.9 % IV SOLN: 500.0000 mL | Freq: Once | INTRAVENOUS | Status: DC

## 2017-11-19 NOTE — Progress Notes (Signed)
Called to room to assist during endoscopic procedure.  Patient ID and intended procedure confirmed with present staff. Received instructions for my participation in the procedure from the performing physician.  

## 2017-11-19 NOTE — Progress Notes (Signed)
Pt's states no medical or surgical changes since previsit or office visit. 

## 2017-11-19 NOTE — Patient Instructions (Signed)
Thank you for allowing [place] to care for you today!  Await pathology results be mail, approximately 2 weeks.  Recommendations for next colonoscopy will be made at that time.  Resume previous diet and medications today.  Return to normal activities tomorrow.  Handout for polyps provided.     YOU HAD AN ENDOSCOPIC PROCEDURE TODAY AT [facility]:   Refer to the procedure report that was given to you for any specific questions about what was found during the examination.  If the procedure report does not answer your questions, please call your gastroenterologist to clarify.  If you requested that your care partner not be given the details of your procedure findings, then the procedure report has been included in a sealed envelope for you to review at your convenience later.  YOU SHOULD EXPECT: Some feelings of bloating in the abdomen. Passage of more gas than usual.  Walking can help get rid of the air that was put into your GI tract during the procedure and reduce the bloating. If you had a lower endoscopy (such as a colonoscopy or flexible sigmoidoscopy) you may notice spotting of blood in your stool or on the toilet paper. If you underwent a bowel prep for your procedure, you may not have a normal bowel movement for a few days.  Please Note:  You might notice some irritation and congestion in your nose or some drainage.  This is from the oxygen used during your procedure.  There is no need for concern and it should clear up in a day or so.  SYMPTOMS TO REPORT IMMEDIATELY:   Following lower endoscopy (colonoscopy or flexible sigmoidoscopy):  Excessive amounts of blood in the stool  Significant tenderness or worsening of abdominal pains  Swelling of the abdomen that is new, acute  Fever of 100F or higher    For urgent or emergent issues, a gastroenterologist can be reached at any hour by calling [phone].   DIET:  We do recommend a small meal at first, but then you  may proceed to your regular diet.  Drink plenty of fluids but you should avoid alcoholic beverages for 24 hours.  ACTIVITY:  You should plan to take it easy for the rest of today and you should NOT DRIVE or use heavy machinery until tomorrow (because of the sedation medicines used during the test).    FOLLOW UP: Our staff will call the number listed on your records the next business day following your procedure to check on you and address any questions or concerns that you may have regarding the information given to you following your procedure. If we do not reach you, we will leave a message.  However, if you are feeling well and you are not experiencing any problems, there is no need to return our call.  We will assume that you have returned to your regular daily activities without incident.  If any biopsies were taken you will be contacted by phone or by letter within the next 1-3 weeks.  Please call us at [phone] if you have not heard about the biopsies in 3 weeks.    SIGNATURES/CONFIDENTIALITY: You and/or your care partner have signed paperwork which will be entered into your electronic medical record.  These signatures attest to the fact that that the information above on your After Visit Summary has been reviewed and is understood.  Full responsibility of the confidentiality of this discharge information lies with you and/or your care-partner.

## 2017-11-19 NOTE — Op Note (Addendum)
[facility] Patient Name: Adrian Schmidt Procedure Date: 11/19/2017 7:49 AM MRN: [id number] Endoscopist: Milus Banister , MD Age: 52 Referring MD:  Date of Birth: Feb 07, 1965 Gender: Male Account #: [id number] Procedure:                Colonoscopy Indications:              Screening for colorectal malignant neoplasm Medicines:                Monitored Anesthesia Care Procedure:                Pre-Anesthesia Assessment:                           - Prior to the procedure, a History and Physical                            was performed, and patient medications and                            allergies were reviewed. The patient's tolerance of                            previous anesthesia was also reviewed. The risks                            and benefits of the procedure and the sedation                            options and risks were discussed with the patient.                            All questions were answered, and informed consent                            was obtained. Prior Anticoagulants: The patient has                            taken no previous anticoagulant or antiplatelet                            agents. ASA Grade Assessment: II - A patient with                            mild systemic disease. After reviewing the risks                            and benefits, the patient was deemed in                            satisfactory condition to undergo the procedure.                           After obtaining informed consent, the colonoscope  was passed under direct vision. Throughout the                            procedure, the patient's blood pressure, pulse, and                            oxygen saturations were monitored continuously. The                            Colonoscope was introduced through the anus and                            advanced to the the cecum, identified by                            appendiceal orifice and  ileocecal valve. The                            colonoscopy was performed without difficulty. The                            patient tolerated the procedure well. The quality                            of the bowel preparation was good. The ileocecal                            valve, appendiceal orifice, and rectum were                            photographed. Scope In: 7:57:09 AM Scope Out: 8:08:49 AM Scope Withdrawal Time: 0 hours 8 minutes 24 seconds  Total Procedure Duration: 0 hours 11 minutes 40 seconds  Findings:                 A 1 mm polyp was found in the ascending colon. The                            polyp was sessile. The polyp was removed with a                            cold biopsy forceps. Resection and retrieval were                            complete.                           The exam was otherwise without abnormality on                            direct and retroflexion views. Complications:            No immediate complications. Estimated blood loss:  None. Estimated Blood Loss:     Estimated blood loss: none. Impression:               - One 1 mm polyp in the ascending colon, removed                            with a cold biopsy forceps. Resected and retrieved.                           - The examination was otherwise normal on direct                            and retroflexion views. Recommendation:           - Patient has a contact number available for                            emergencies. The signs and symptoms of potential                            delayed complications were discussed with the                            patient. Return to normal activities tomorrow.                            Written discharge instructions were provided to the                            patient.                           - Resume previous diet.                           - Continue present medications.                           You will receive a  letter within 2-3 weeks with the                            pathology results and my final recommendations.                           If the polyp(s) is proven to be 'pre-cancerous' on                            pathology, you will need repeat colonoscopy in 5                            years. If the polyp(s) is NOT 'precancerous' on                            pathology then you should repeat colon cancer  screening in 10 years with colonoscopy without need                            for colon cancer screening by any method prior to                            then (including stool testing). Milus Banister, MD 11/19/2017 8:11:13 AM This report has been signed electronically.

## 2017-11-19 NOTE — Progress Notes (Signed)
To PACU, VSS. Report to [email or web address] 

## 2017-11-20 ENCOUNTER — Telehealth: Payer: Self-pay

## 2017-11-20 NOTE — Telephone Encounter (Signed)
  Follow up Call-  Call back number 11/19/2017  Post procedure Call Back phone  # [phone]  Permission to leave phone message Yes  Some recent data might be hidden     Patient questions:  Do you have a fever, pain , or abdominal swelling? No. Pain Score  0 *  Have you tolerated food without any problems? Yes.    Have you been able to return to your normal activities? Yes.    Do you have any questions about your discharge instructions: Diet   No. Medications  No. Follow up visit  No.  Do you have questions or concerns about your Care? No.  Actions: * If pain score is 4 or above: No action needed, pain <4.

## 2017-11-22 ENCOUNTER — Encounter: Payer: Self-pay | Admitting: Vascular Surgery

## 2017-11-22 ENCOUNTER — Other Ambulatory Visit (INDEPENDENT_AMBULATORY_CARE_PROVIDER_SITE_OTHER): Payer: 59

## 2017-11-22 ENCOUNTER — Other Ambulatory Visit: Payer: Self-pay

## 2017-11-22 ENCOUNTER — Ambulatory Visit (INDEPENDENT_AMBULATORY_CARE_PROVIDER_SITE_OTHER): Payer: 59 | Admitting: Vascular Surgery

## 2017-11-22 VITALS — BP 110/70 | HR 68 | Temp 98.1°F | Resp 18 | Ht 73.0 in | Wt 267.0 lb

## 2017-11-22 DIAGNOSIS — R2242 Localized swelling, mass and lump, left lower limb: Secondary | ICD-10-CM | POA: Diagnosis not present

## 2017-11-22 DIAGNOSIS — R7989 Other specified abnormal findings of blood chemistry: Secondary | ICD-10-CM | POA: Diagnosis not present

## 2017-11-22 NOTE — Addendum Note (Signed)
Addended by: Caffie Pinto on: 11/22/2017 08:01 AM   Modules accepted: Orders

## 2017-11-22 NOTE — Progress Notes (Signed)
Patient ID: Adrian Schmidt, male   DOB: 26-Feb-1965, 52 y.o.   MRN: [id number]  Reason for Consult: New Patient (Initial Visit) (mass near left knee)   Referred by Shelda Pal*  Subjective:     HPI:  Adrian Schmidt is a 52 y.o. male without significant past medical history presents with a 3-year history of a left lateral knee mass.  He has been evaluated by orthopedics in the past at [facility] had an ultrasound which demonstrated a possible varicosity but no other explanation for the mass.  He states that it does give him some aching overlying the mass and hurts when he applies pressure.  This does not prevent him from remaining active and he works full-time at [employer].  He also works out.  He is a non-smoker occasionally drinks alcohol does not take any illicit drugs.  He is never had any lower extremity surgeries.  He has no injury to his left lower extremity.  Denies any claudication.  No tissue loss or ulceration.  Mostly he is concerned because he is now has some pain in his knee with walking in the joint over the past couple months.  The mass has not grown in size over the past 3 years since he first noticed it is no overlying skin changes and never has.  Past Medical History:  Diagnosis Date  . Allergy   . Atypical chest pain    negative stress and 2 D echo 2006  . Erectile dysfunction   . GERD (gastroesophageal reflux disease)   . Herpes genitalia   . Hyperlipidemia   . Low testosterone    normal iron, LH, FSH and estradiol  . Sleep apnea    CPAP   Family History  Problem Relation Age of Onset  . Cancer Maternal Grandfather        prostate and colon  . Colon cancer Maternal Grandfather   . Diabetes Maternal Grandmother   . Heart disease Maternal Grandmother        CAD  . Esophageal cancer Neg Hx   . Rectal cancer Neg Hx   . Stomach cancer Neg Hx    Past Surgical History:  Procedure Laterality Date  . SMALL INTESTINE SURGERY     stretching of  small intestine-- ring removal. Dr. Guadlupe Spanish Social History:  Social History   Tobacco Use  . Smoking status: Never Smoker  . Smokeless tobacco: Never Used  Substance Use Topics  . Alcohol use: Yes    Comment: socially    No Known Allergies  Current Outpatient Medications  Medication Sig Dispense Refill  . aspirin EC 81 MG tablet Take 81 mg by mouth daily.    Marland Kitchen atorvastatin (LIPITOR) 40 MG tablet Take 1 tablet (40 mg total) by mouth daily. 90 tablet 3  . esomeprazole (NEXIUM) 20 MG capsule Take 20 mg by mouth as needed.    . fluticasone (FLONASE) 50 MCG/ACT nasal spray Place 2 sprays into both nostrils as needed for allergies or rhinitis.    Marland Kitchen ketoconazole (NIZORAL) 2 % shampoo Apply topically daily. Apply daily for 3 days 120 mL 3  . Multiple Vitamin (MULTIVITAMIN) tablet Take 1 tablet by mouth daily.    . naproxen (EC NAPROSYN) 500 MG EC tablet Take 1 tablet (500 mg total) by mouth 2 (two) times daily with a meal. 30 tablet 0  . OVER THE COUNTER MEDICATION Bayer Body and Back 500 mg. One capsule as needed.    Marland Kitchen  OVER THE COUNTER MEDICATION Hydroxy Cut Black, Dietary Supplement, one capsule three times a week.    . ranitidine (ZANTAC 150 MAXIMUM STRENGTH) 150 MG tablet Take 1 tablet (150 mg total) by mouth 2 (two) times daily. 30 tablet 2  . vardenafil (LEVITRA) 20 MG tablet Take 1 tablet (20 mg total) by mouth daily as needed for erectile dysfunction. 10 tablet 0   No current facility-administered medications for this visit.     Review of Systems  Constitutional:  Constitutional negative. HENT: HENT negative.  Eyes: Eyes negative.  Respiratory: Respiratory negative.  Cardiovascular: Cardiovascular negative.  GI: Gastrointestinal negative.  Musculoskeletal: Positive for leg pain.  Skin: Skin negative.  Neurological: Neurological negative. Hematologic: Hematologic/lymphatic negative.  Psychiatric: Psychiatric negative.        Objective:  Objective   Vitals:    11/22/17 0904  BP: 110/70  Pulse: 68  Resp: 18  Temp: 98.1 F (36.7 C)  TempSrc: Oral  SpO2: (!) 68%  Weight: 267 lb (121.1 kg)  Height: 6\' 1"  (1.854 m)   Body mass index is 35.23 kg/m.  Physical Exam  Constitutional: He is oriented to person, place, and time. He appears well-developed.  HENT:  Head: Normocephalic.  Eyes: Pupils are equal, round, and reactive to light.  Neck: Normal range of motion.  Cardiovascular: Normal rate.  Pulses:      Radial pulses are 2+ on the right side, and 2+ on the left side.  I cannot readily palpate popliteal pulses  Pulmonary/Chest: Effort normal.  Musculoskeletal: Normal range of motion. He exhibits no edema or deformity.  I cannot identify any abnormality of his left lateral knee there is no palpable mass although he does have pain with deep palpation overlying the site.  Neurological: He is alert and oriented to person, place, and time.  Skin: Skin is warm and dry.  Psychiatric: He has a normal mood and affect. His behavior is normal. Judgment and thought content normal.    Data: I reviewed his previous ultrasound that was performed and demonstrated a possible varicosity lateral to his left knee although I cannot replicate this with bedside ultrasound today and I cannot demonstrate any mass with the ultrasound either.     Assessment/Plan:     52 year old male presents for evaluation of left lateral knee mass.  This is been present for 3 years and has had ultrasound which demonstrated possible varicosity in the past and MRI was ordered in October 2018 but was never performed after orthopedic evaluation in [place].  With bedside ultrasound I cannot identify either mass nor varicosity.  I can also not palpate any mass although he can feel it and he does have pain with deep palpation.  Given that has been present for 3 years I do not think it is cancerous.  I see no reason for ultrasound to evaluate venous reflux given that he has no  swelling or other varicose veins and I do not see a large varicosity with bedside ultrasound exam.  With this I think we will obtain the MRI of his left knee to rule out what this is.  I discussed with him possible outcomes which possibly no need for intervention versus referral to a local knee specialist.  He demonstrates good understanding we will get the MRI and have him follow-up afterwards.     Waynetta Sandy MD [facility]

## 2017-11-24 ENCOUNTER — Encounter: Payer: Self-pay | Admitting: Gastroenterology

## 2017-11-24 LAB — TESTOSTERONE, FREE, TOTAL, SHBG
Sex Hormone Binding: 13.6 nmol/L — ABNORMAL LOW (ref 19.3–76.4)
TESTOSTERONE FREE: 11.5 pg/mL (ref 7.2–24.0)
Testosterone: 203 ng/dL — ABNORMAL LOW (ref 264–916)

## 2017-11-25 ENCOUNTER — Telehealth: Payer: Self-pay | Admitting: *Deleted

## 2017-11-25 NOTE — Telephone Encounter (Signed)
Received Lab Report results from [facility]; forwarded to provider/SLS 11/25

## 2017-11-27 ENCOUNTER — Other Ambulatory Visit: Payer: Self-pay

## 2017-12-02 ENCOUNTER — Ambulatory Visit (HOSPITAL_BASED_OUTPATIENT_CLINIC_OR_DEPARTMENT_OTHER)
Admission: RE | Admit: 2017-12-02 | Discharge: 2017-12-02 | Disposition: A | Discharge status: Home / Self Care | Payer: 59 | Source: Ambulatory Visit | Attending: Cardiology | Admitting: Cardiology

## 2017-12-02 DIAGNOSIS — R0789 Other chest pain: Secondary | ICD-10-CM | POA: Diagnosis present

## 2017-12-02 NOTE — Progress Notes (Signed)
  Echocardiogram Echocardiogram Stress Test has been performed.  Adrian Schmidt Adrian Schmidt 12/02/2017, 9:19 AM

## 2017-12-08 ENCOUNTER — Ambulatory Visit
Admission: RE | Admit: 2017-12-08 | Discharge: 2017-12-08 | Disposition: A | Discharge status: Home / Self Care | Payer: 59 | Source: Ambulatory Visit | Attending: Vascular Surgery | Admitting: Vascular Surgery

## 2017-12-08 DIAGNOSIS — R2242 Localized swelling, mass and lump, left lower limb: Secondary | ICD-10-CM

## 2017-12-08 MED ORDER — GADOBENATE DIMEGLUMINE 529 MG/ML IV SOLN
20.0000 mL | Freq: Once | INTRAVENOUS | Status: AC | PRN
  Administered 2017-12-08: 20 mL via INTRAVENOUS

## 2018-01-13 ENCOUNTER — Encounter: Payer: Self-pay | Admitting: Family Medicine

## 2018-01-13 ENCOUNTER — Other Ambulatory Visit: Payer: Self-pay | Admitting: Family Medicine

## 2018-01-13 MED ORDER — VARDENAFIL HCL 20 MG PO TABS: 20.0000 mg | ORAL_TABLET | Freq: Every day | ORAL | 0 refills | Status: DC | PRN

## 2018-01-24 ENCOUNTER — Encounter: Payer: Self-pay | Admitting: Vascular Surgery

## 2018-01-24 ENCOUNTER — Ambulatory Visit (INDEPENDENT_AMBULATORY_CARE_PROVIDER_SITE_OTHER): Payer: 59 | Admitting: Vascular Surgery

## 2018-01-24 ENCOUNTER — Other Ambulatory Visit: Payer: Self-pay

## 2018-01-24 VITALS — BP 124/79 | HR 89 | Resp 18 | Ht 73.0 in | Wt 268.0 lb

## 2018-01-24 DIAGNOSIS — R2242 Localized swelling, mass and lump, left lower limb: Secondary | ICD-10-CM | POA: Diagnosis not present

## 2018-01-24 NOTE — Progress Notes (Signed)
Patient ID: Adrian Schmidt, male   DOB: 1965-02-23, 53 y.o.   MRN: [id number]  Reason for Consult: Follow-up (2 month f/u )   Referred by Shelda Pal*  Subjective:     HPI:  Adrian Schmidt is a 53 y.o. male here for 73-month follow-up for left lateral knee pain.  States that it continues to be there on an intermittent basis and has been present for 3 years.  He did undergo MRI prior to this visit.  He has had previous orthopedic evaluation was sent here for concern for varicosity.  Does not have any overlying skin changes.  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  No other symptoms have arisen in the interim visit.  Past Medical History:  Diagnosis Date  . Allergy   . Atypical chest pain    negative stress and 2 D echo 2006  . Erectile dysfunction   . GERD (gastroesophageal reflux disease)   . Herpes genitalia   . Hyperlipidemia   . Low testosterone    normal iron, LH, FSH and estradiol  . Sleep apnea    CPAP   Family History  Problem Relation Age of Onset  . Cancer Maternal Grandfather        prostate and colon  . Colon cancer Maternal Grandfather   . Diabetes Maternal Grandmother   . Heart disease Maternal Grandmother        CAD  . Esophageal cancer Neg Hx   . Rectal cancer Neg Hx   . Stomach cancer Neg Hx    Past Surgical History:  Procedure Laterality Date  . SMALL INTESTINE SURGERY     stretching of small intestine-- ring removal. Dr. Guadlupe Spanish Social History:  Social History   Tobacco Use  . Smoking status: Never Smoker  . Smokeless tobacco: Never Used  Substance Use Topics  . Alcohol use: Yes    Comment: socially    No Known Allergies  Current Outpatient Medications  Medication Sig Dispense Refill  . aspirin EC 81 MG tablet Take 81 mg by mouth daily.    Marland Kitchen atorvastatin (LIPITOR) 40 MG tablet Take 1 tablet (40 mg total) by mouth daily. 90  tablet 3  . esomeprazole (NEXIUM) 20 MG capsule Take 20 mg by mouth as needed.    . fluticasone (FLONASE) 50 MCG/ACT nasal spray Place 2 sprays into both nostrils as needed for allergies or rhinitis.    Marland Kitchen ketoconazole (NIZORAL) 2 % shampoo Apply topically daily. Apply daily for 3 days 120 mL 3  . Multiple Vitamin (MULTIVITAMIN) tablet Take 1 tablet by mouth daily.    . naproxen (EC NAPROSYN) 500 MG EC tablet Take 1 tablet (500 mg total) by mouth 2 (two) times daily with a meal. 30 tablet 0  . OVER THE COUNTER MEDICATION Bayer Body and Back 500 mg. One capsule as needed.    Marland Kitchen OVER THE COUNTER MEDICATION Hydroxy Cut Black, Dietary Supplement, one capsule three times a week.    . ranitidine (ZANTAC 150 MAXIMUM STRENGTH) 150 MG tablet Take 1 tablet (150 mg total) by mouth 2 (two) times daily. 30 tablet 2  . vardenafil (LEVITRA) 20 MG tablet Take 1 tablet (20 mg total) by mouth daily as needed for erectile dysfunction. 10 tablet 0   No current facility-administered medications for this visit.     Review of Systems  Constitutional:  Constitutional  negative. HENT: HENT negative.  Eyes: Eyes negative.  Respiratory: Respiratory negative.  Cardiovascular: Cardiovascular negative.  GI: Gastrointestinal negative.  Musculoskeletal: Positive for joint pain.  Skin: Skin negative.  Neurological: Neurological negative. Hematologic: Hematologic/lymphatic negative.  Psychiatric: Psychiatric negative.        Objective:  Objective   Vitals:   01/24/18 0941  BP: 124/79  Pulse: 89  Resp: 18  SpO2: 96%  Weight: 268 lb (121.6 kg)  Height: 6\' 1"  (1.854 m)   Body mass index is 35.36 kg/m.  Physical Exam HENT:     Head: Normocephalic.  Eyes:     Pupils: Pupils are equal, round, and reactive to light.  Cardiovascular:     Rate and Rhythm: Normal rate.     Pulses:          Popliteal pulses are 2+ on the right side and 2+ on the left side.       Dorsalis pedis pulses are 2+ on the right side  and 2+ on the left side.       Posterior tibial pulses are 2+ on the right side and 2+ on the left side.  Pulmonary:     Effort: Pulmonary effort is normal.  Abdominal:     General: Abdomen is flat.     Palpations: Abdomen is soft.  Musculoskeletal: Normal range of motion.        General: No swelling.     Comments: There is no identifiable abnormality either visually or palpably where he feels pain in his left lateral knee area  Skin:    General: Skin is warm and dry.  Neurological:     General: No focal deficit present.     Mental Status: He is alert.  Psychiatric:        Mood and Affect: Mood normal.        Behavior: Behavior normal.        Thought Content: Thought content normal.        Judgment: Judgment normal.     Data: IMPRESSION: 1. Skin marker at the site of the abnormality as confirmed by the patient. No soft tissue mass, fluid collection or hematoma at the site of clinical concern overlying the distal lateral femoral condyle. 2. Minimal edema in superolateral Hoffa's fat as can be seen with patellar tendon-lateral femoral condyle friction syndrome.     I reviewed his MRI with him demonstrating no abnormality at the site of the marker.  Assessment/Plan:     53 year old male follows up for left lateral knee pain now with MRI.  Previously I used ultrasound at the bedside could not identify any cystic or varicose abnormalities and this is confirmed with MRI.  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  We discussed this thoroughly and he demonstrates very good understanding.  He would like referral to an orthopedic surgeon and we will place that today.  He can follow-up with me on an as-needed basis.     Waynetta Sandy MD [facility]

## 2018-02-27 ENCOUNTER — Encounter: Payer: Self-pay | Admitting: Family Medicine

## 2018-02-27 MED ORDER — FLUTICASONE PROPIONATE 50 MCG/ACT NA SUSP: 2.0000 | NASAL | 5 refills | Status: DC | PRN

## 2018-05-07 ENCOUNTER — Other Ambulatory Visit: Payer: Self-pay | Admitting: Family Medicine

## 2018-05-07 ENCOUNTER — Encounter: Payer: Self-pay | Admitting: Family Medicine

## 2018-05-07 DIAGNOSIS — G4733 Obstructive sleep apnea (adult) (pediatric): Secondary | ICD-10-CM

## 2018-05-28 ENCOUNTER — Telehealth: Payer: Self-pay | Admitting: Family Medicine

## 2018-05-28 NOTE — Telephone Encounter (Signed)
Copied from [place] [id number]. Topic: General - Other >> May 28, 2018  3:41 PM Valla Leaver wrote: Reason for CRM: Patient would like a call back from [facility] about imaging he had done.   Patient requested copy of MRI done 12/2017 Copied/put in envelope and patient will pickup at the front desk.

## 2018-05-29 ENCOUNTER — Ambulatory Visit (INDEPENDENT_AMBULATORY_CARE_PROVIDER_SITE_OTHER): Payer: 59 | Admitting: Adult Health

## 2018-05-29 ENCOUNTER — Other Ambulatory Visit: Payer: Self-pay | Admitting: Family Medicine

## 2018-05-29 ENCOUNTER — Encounter: Payer: Self-pay | Admitting: Adult Health

## 2018-05-29 ENCOUNTER — Other Ambulatory Visit: Payer: Self-pay

## 2018-05-29 DIAGNOSIS — G4733 Obstructive sleep apnea (adult) (pediatric): Secondary | ICD-10-CM | POA: Diagnosis not present

## 2018-05-29 DIAGNOSIS — S99911A Unspecified injury of right ankle, initial encounter: Secondary | ICD-10-CM

## 2018-05-29 DIAGNOSIS — E669 Obesity, unspecified: Secondary | ICD-10-CM

## 2018-05-29 DIAGNOSIS — Z6834 Body mass index (BMI) 34.0-34.9, adult: Secondary | ICD-10-CM

## 2018-05-29 MED ORDER — VARDENAFIL HCL 20 MG PO TABS: 20.0000 mg | ORAL_TABLET | Freq: Every day | ORAL | 0 refills | Status: DC | PRN

## 2018-05-29 MED ORDER — FAMOTIDINE 20 MG PO TABS: 20.0000 mg | ORAL_TABLET | Freq: Every day | ORAL | 1 refills | Status: DC

## 2018-05-29 MED ORDER — NAPROXEN 500 MG PO TBEC: 500.0000 mg | DELAYED_RELEASE_TABLET | Freq: Two times a day (BID) | ORAL | 0 refills | Status: DC

## 2018-05-29 MED ORDER — ESOMEPRAZOLE MAGNESIUM 40 MG PO CPDR: 40.0000 mg | DELAYED_RELEASE_CAPSULE | Freq: Every day | ORAL | 1 refills | Status: DC

## 2018-05-29 NOTE — Assessment & Plan Note (Signed)
-   Weight loss 

## 2018-05-29 NOTE — Assessment & Plan Note (Signed)
Severe sleep apnea.  Poor CPAP usage.  Patient is encouraged on CPAP compliance.  Plan  Patient Instructions  On C- Pap at bedtime. Wear each night, try to get at least 4-6 hours. Work on health weight .  Do not drive if sleepy Follow up with Dr. Halford Chessman  In 1 year and As needed

## 2018-05-29 NOTE — Progress Notes (Signed)
@Patient  ID: Adrian Schmidt, male    DOB: [birth year], 53 y.o.   MRN: [id number]  Chief Complaint  Patient presents with  . Follow-up    OSA    Referring provider: Shelda Pal*  HPI: 53 year old male followed for severe obstructive sleep apnea  TEST/EVENTS :  HST 11/2012  >AHI 33.    05/29/2018 Follow up : OSA  Patient returns for a follow-up for sleep apnea.  Patient was last seen in April 2018.  Patient has underlying severe sleep apnea.  He is on nocturnal CPAP.  Patient says he tries to wear CPAP but wears it about half the time.  We discussed importance of CPAP usage and compliance.  Discussed potential complications of untreated sleep apnea.  Says he feels much better when he wears it .  Download shows poor CPAP usage with 43%.  Patient on average use is at 6 hours.  Patient is on auto CPAP 5 to 15 cm of H2O.  Daily average pressure at 13 cm H2O.  AHI 5.9.  Minimum leaks.  Does have GERD and worse at night feels he might get sick at night if he wear cpap . Eats late and then goes to bed.  Works late shifts.    No Known Allergies  Immunization History  Administered Date(s) Administered  . Td 02/24/2008    Past Medical History:  Diagnosis Date  . Allergy   . Atypical chest pain    negative stress and 2 D echo 2006  . Erectile dysfunction   . GERD (gastroesophageal reflux disease)   . Herpes genitalia   . Hyperlipidemia   . Low testosterone    normal iron, LH, FSH and estradiol  . Sleep apnea    CPAP    Tobacco History: Social History   Tobacco Use  Smoking Status Never Smoker  Smokeless Tobacco Never Used   Counseling given: Not Answered   Outpatient Medications Prior to Visit  Medication Sig Dispense Refill  . aspirin EC 81 MG tablet Take 81 mg by mouth daily.    Marland Kitchen atorvastatin (LIPITOR) 40 MG tablet Take 1 tablet (40 mg total) by mouth daily. 90 tablet 3  . fluticasone (FLONASE) 50 MCG/ACT nasal spray Place 2 sprays into both nostrils  as needed for allergies or rhinitis. 16 g 5  . ketoconazole (NIZORAL) 2 % shampoo Apply topically daily. Apply daily for 3 days 120 mL 3  . Multiple Vitamin (MULTIVITAMIN) tablet Take 1 tablet by mouth daily.    Marland Kitchen OVER THE COUNTER MEDICATION Bayer Body and Back 500 mg. One capsule as needed.    Marland Kitchen esomeprazole (NEXIUM) 20 MG capsule Take 20 mg by mouth as needed.    . vardenafil (LEVITRA) 20 MG tablet Take 1 tablet (20 mg total) by mouth daily as needed for erectile dysfunction. 10 tablet 0  . ranitidine (ZANTAC 150 MAXIMUM STRENGTH) 150 MG tablet Take 1 tablet (150 mg total) by mouth 2 (two) times daily. (Patient not taking: Reported on 05/29/2018) 30 tablet 2  . naproxen (EC NAPROSYN) 500 MG EC tablet Take 1 tablet (500 mg total) by mouth 2 (two) times daily with a meal. (Patient not taking: Reported on 05/29/2018) 30 tablet 0  . OVER THE COUNTER MEDICATION Hydroxy Cut Black, Dietary Supplement, one capsule three times a week.     No facility-administered medications prior to visit.      Review of Systems:   Constitutional:   No  weight loss, night sweats,  Fevers,  chills, fatigue, or  lassitude.  HEENT:   No headaches,  Difficulty swallowing,  Tooth/dental problems, or  Sore throat,                No sneezing, itching, ear ache, nasal congestion, post nasal drip,   CV:  No chest pain,  Orthopnea, PND, swelling in lower extremities, anasarca, dizziness, palpitations, syncope.   GI  No heartburn, indigestion, abdominal pain, nausea, vomiting, diarrhea, change in bowel habits, loss of appetite, bloody stools.   Resp: No shortness of breath with exertion or at rest.  No excess mucus, no productive cough,  No non-productive cough,  No coughing up of blood.  No change in color of mucus.  No wheezing.  No chest wall deformity  Skin: no rash or lesions.  GU: no dysuria, change in color of urine, no urgency or frequency.  No flank pain, no hematuria   MS:  No joint pain or swelling.  No  decreased range of motion.  No back pain.    Physical Exam  BP 122/78 (BP Location: Left Arm, Cuff Size: Normal)   Pulse 83   Temp 97.6 F (36.4 C) (Oral)   Ht 6\' 2"  (1.88 m)   Wt 271 lb 6.4 oz (123.1 kg)   SpO2 99%   BMI 34.85 kg/m   GEN: A/Ox3; pleasant , NAD, obese    HEENT:  [place]/AT,  EACs-clear, TMs-wnl, NOSE-clear, THROAT-clear, no lesions, no postnasal drip or exudate noted. Class 3 MP airway   NECK:  Supple w/ fair ROM; no JVD; normal carotid impulses w/o bruits; no thyromegaly or nodules palpated; no lymphadenopathy.    RESP  Clear  P & A; w/o, wheezes/ rales/ or rhonchi. no accessory muscle use, no dullness to percussion  CARD:  RRR, no m/r/g, no peripheral edema, pulses intact, no cyanosis or clubbing.  GI:   Soft & nt; nml bowel sounds; no organomegaly or masses detected.   Musco: Warm bil, no deformities or joint swelling noted.   Neuro: alert, no focal deficits noted.    Skin: Warm, no lesions or rashes    Lab Results:  CBC  BNP No results found for: BNP  ProBNP No results found for: PROBNP  Imaging: No results found.    No flowsheet data found.  No results found for: NITRICOXIDE      Assessment & Plan:   OSA (obstructive sleep apnea) Severe sleep apnea.  Poor CPAP usage.  Patient is encouraged on CPAP compliance.  Plan  Patient Instructions  On C- Pap at bedtime. Wear each night, try to get at least 4-6 hours. Work on health weight .  Do not drive if sleepy Follow up with Dr. Halford Chessman  In 1 year and As needed        OBESITY Weight loss      Rexene Edison, NP 05/29/2018

## 2018-05-29 NOTE — Patient Instructions (Addendum)
On C- Pap at bedtime. Wear each night, try to get at least 4-6 hours. Work on health weight .  Do not drive if sleepy GERD diet  No eating 3 hrs before bedtime  Begin Nexium 40mg   daily in am before meal for 6 weeks  Begin Pepcid 20mg  At bedtime for 6 weeks  Follow up with Primary MD in 4-6 weeks for GERD .  Order to change DME to [facility] .  Follow up with Dr. Halford Chessman  In 1 year and As needed   Please contact office for sooner follow up if symptoms do not improve or worsen or seek emergency care

## 2018-05-30 DIAGNOSIS — G4733 Obstructive sleep apnea (adult) (pediatric): Secondary | ICD-10-CM

## 2018-06-05 NOTE — Addendum Note (Signed)
Addended by: Jannette Spanner on: 06/05/2018 03:11 PM   Modules accepted: Orders

## 2018-06-06 NOTE — Telephone Encounter (Signed)
Download printed and given to TP , will await order for adjustable pressure setting or a set pressure setting

## 2018-06-06 NOTE — Telephone Encounter (Signed)
Patient states He wants to be on an adjustable pressure not a set pressure and had talk with [place] regarding this issue, but order was not placed for [facility] to change settings.  TP please advise or settings change

## 2018-06-06 NOTE — Telephone Encounter (Signed)
Can we check his last pressure setting , I have listed he is on auto set 5 to 15 cm h2o  Please get a [employer] if not available then check with DME on settings

## 2018-06-27 ENCOUNTER — Encounter: Payer: Self-pay | Admitting: Family Medicine

## 2018-07-09 ENCOUNTER — Encounter (HOSPITAL_BASED_OUTPATIENT_CLINIC_OR_DEPARTMENT_OTHER): Payer: Self-pay | Admitting: *Deleted

## 2018-07-09 ENCOUNTER — Other Ambulatory Visit: Payer: Self-pay

## 2018-07-09 ENCOUNTER — Emergency Department (HOSPITAL_BASED_OUTPATIENT_CLINIC_OR_DEPARTMENT_OTHER)
Admission: EM | Admit: 2018-07-09 | Discharge: 2018-07-09 | Disposition: A | Discharge status: Home / Self Care | Payer: 59 | Attending: Emergency Medicine | Admitting: Emergency Medicine

## 2018-07-09 DIAGNOSIS — Y939 Activity, unspecified: Secondary | ICD-10-CM | POA: Diagnosis not present

## 2018-07-09 DIAGNOSIS — Y999 Unspecified external cause status: Secondary | ICD-10-CM | POA: Insufficient documentation

## 2018-07-09 DIAGNOSIS — Z7982 Long term (current) use of aspirin: Secondary | ICD-10-CM | POA: Diagnosis not present

## 2018-07-09 DIAGNOSIS — S60861A Insect bite (nonvenomous) of right wrist, initial encounter: Secondary | ICD-10-CM | POA: Diagnosis not present

## 2018-07-09 DIAGNOSIS — Z79899 Other long term (current) drug therapy: Secondary | ICD-10-CM | POA: Diagnosis not present

## 2018-07-09 DIAGNOSIS — W57XXXA Bitten or stung by nonvenomous insect and other nonvenomous arthropods, initial encounter: Secondary | ICD-10-CM | POA: Insufficient documentation

## 2018-07-09 DIAGNOSIS — Y929 Unspecified place or not applicable: Secondary | ICD-10-CM | POA: Insufficient documentation

## 2018-07-09 DIAGNOSIS — M25531 Pain in right wrist: Secondary | ICD-10-CM | POA: Diagnosis present

## 2018-07-09 MED ORDER — DIPHENHYDRAMINE HCL 25 MG PO CAPS
25.0000 mg | ORAL_CAPSULE | Freq: Once | ORAL | Status: AC
  Administered 2018-07-09: 25 mg via ORAL
  Filled 2018-07-09: qty 1

## 2018-07-09 NOTE — ED Provider Notes (Signed)
[facility]GENCY DEPARTMENT Provider Note   CSN: [id number] Arrival date & time: 07/09/18  2141    History   Chief Complaint Chief Complaint  Patient presents with  . Insect Bite    HPI Adrian Schmidt is a 53 y.o. male.     The history is provided by the patient.  Animal Bite Contact animal:  Insect Pain details:    Quality:  Aching   Severity:  Mild   Timing:  Intermittent   Progression:  Waxing and waning Relieved by:  Nothing Worsened by:  Nothing Associated symptoms: swelling   Associated symptoms: no fever and no numbness     Past Medical History:  Diagnosis Date  . Allergy   . Atypical chest pain    negative stress and 2 D echo 2006  . Erectile dysfunction   . GERD (gastroesophageal reflux disease)   . Herpes genitalia   . Hyperlipidemia   . Low testosterone    normal iron, LH, FSH and estradiol  . Sleep apnea    CPAP    Patient Active Problem List   Diagnosis Date Noted  . Mixed dyslipidemia 11/15/2017  . Chest discomfort 11/15/2017  . Erectile dysfunction 11/01/2017  . Other fatigue 11/01/2017  . Screening for prostate cancer 08/28/2017  . Strain of calf muscle, initial encounter 11/15/2016  . Knee mass, left 06/05/2016  . Left knee pain 11/09/2015  . GERD (gastroesophageal reflux disease) 11/09/2015  . OSA (obstructive sleep apnea) 11/14/2012  . Screening for STD (sexually transmitted disease) 06/06/2012  . Preventative health care 04/27/2011  . SCHATZKI'S RING 10/14/2009  . Schatzki's ring 10/14/2009  . OTHER DYSPHAGIA 10/07/2009  . CONDYLOMA 04/01/2009  . ALLERGIC RHINITIS 03/23/2008  . HYPOGONADISM 02/25/2008  . HYPERLIPIDEMIA 02/24/2008  . OBESITY 02/24/2008    Past Surgical History:  Procedure Laterality Date  . SMALL INTESTINE SURGERY     stretching of small intestine-- ring removal. Dr. Diona Fanti        Home Medications    Prior to Admission medications   Medication Sig Start Date End Date Taking?  Authorizing Provider  aspirin EC 81 MG tablet Take 81 mg by mouth daily.    [provider]  atorvastatin (LIPITOR) 40 MG tablet Take 1 tablet (40 mg total) by mouth daily. 11/04/17   Shelda Pal, DO  esomeprazole (NEXIUM) 40 MG capsule Take 1 capsule (40 mg total) by mouth daily. 05/29/18 05/29/19  Parrett, Fonnie Mu, NP  famotidine (PEPCID) 20 MG tablet Take 1 tablet (20 mg total) by mouth at bedtime. 05/29/18   Parrett, Fonnie Mu, NP  fluticasone (FLONASE) 50 MCG/ACT nasal spray Place 2 sprays into both nostrils as needed for allergies or rhinitis. 02/27/18   Shelda Pal, DO  ketoconazole (NIZORAL) 2 % shampoo Apply topically daily. Apply daily for 3 days 08/28/17   Shelda Pal, DO  Multiple Vitamin (MULTIVITAMIN) tablet Take 1 tablet by mouth daily.    [provider]  naproxen (EC NAPROSYN) 500 MG EC tablet Take 1 tablet (500 mg total) by mouth 2 (two) times daily with a meal. 05/29/18   Wendling, Crosby Oyster, DO  OVER THE COUNTER MEDICATION Bayer Body and Back 500 mg. One capsule as needed.    [provider]  ranitidine (ZANTAC 150 MAXIMUM STRENGTH) 150 MG tablet Take 1 tablet (150 mg total) by mouth 2 (two) times daily. Patient not taking: Reported on 05/29/2018 10/08/16   Shelda Pal, DO  vardenafil ([place]) 20  MG tablet Take 1 tablet (20 mg total) by mouth daily as needed for erectile dysfunction. 05/29/18   Shelda Pal, DO    Family History Family History  Problem Relation Age of Onset  . Cancer Maternal Grandfather        prostate and colon  . Colon cancer Maternal Grandfather   . Diabetes Maternal Grandmother   . Heart disease Maternal Grandmother        CAD  . Esophageal cancer Neg Hx   . Rectal cancer Neg Hx   . Stomach cancer Neg Hx     Social History Social History   Tobacco Use  . Smoking status: Never Smoker  . Smokeless tobacco: Never Used  Substance Use Topics  . Alcohol use: Yes     Comment: socially  . Drug use: No     Allergies   Patient has no known allergies.   Review of Systems Review of Systems  Constitutional: Negative for fever.  Musculoskeletal: Positive for arthralgias. Negative for back pain, gait problem, joint swelling, myalgias, neck pain and neck stiffness.  Neurological: Negative for weakness and numbness.     Physical Exam Updated Vital Signs  ED Triage Vitals  Enc Vitals Group     BP 07/09/18 2153 137/89     Pulse Rate 07/09/18 2153 96     Resp 07/09/18 2153 16     Temp 07/09/18 2153 98.8 F (37.1 C)     Temp Source 07/09/18 2153 Oral     SpO2 07/09/18 2153 100 %     Weight 07/09/18 2151 270 lb (122.5 kg)     Height 07/09/18 2151 6\' 2"  (1.88 m)     Head Circumference --      Peak Flow --      Pain Score 07/09/18 2150 4     Pain Loc --      Pain Edu? --      Excl. in [place]? --     Physical Exam Constitutional:      General: He is not in acute distress.    Appearance: He is not ill-appearing.  Cardiovascular:     Pulses: Normal pulses.  Musculoskeletal: Normal range of motion.        General: Swelling present. No tenderness.  Skin:    General: Skin is warm.     Capillary Refill: Capillary refill takes less than 2 seconds.     Comments: Possibly some mild swelling to the right wrist area, no redness  Neurological:     General: No focal deficit present.     Mental Status: He is alert.      ED Treatments / Results  Labs (all labs ordered are listed, but only abnormal results are displayed) Labs Reviewed - No data to display  EKG None  Radiology No results found.  Procedures Procedures (including critical care time)  Medications Ordered in ED Medications  diphenhydrAMINE (BENADRYL) capsule 25 mg (has no administration in time range)     Initial Impression / Assessment and Plan / ED Course  I have reviewed the triage vital signs and the nursing notes.  Pertinent labs & imaging results that were available  during my care of the patient were reviewed by me and considered in my medical decision making (see chart for details).        Adrian Schmidt is a 53 year old male here with insect bite to the right wrist.  Some mild swelling.  No redness.  Normal vitals.  Given Benadryl.  No signs of anaphylaxis.  Overall given reassurance.  Recommend continue use of Benadryl.  Discharged in ED in good condition.  This chart was dictated using voice recognition software.  Despite best efforts to proofread,  errors can occur which can change the documentation meaning.    Final Clinical Impressions(s) / ED Diagnoses   Final diagnoses:  Insect bite, unspecified site, initial encounter    ED Discharge Orders    None       Lennice Sites, DO 07/09/18 2206

## 2018-07-09 NOTE — ED Triage Notes (Signed)
Pt c/o insect bite to right wrist x 10 hrs ago

## 2018-07-09 NOTE — Discharge Instructions (Addendum)
Use Benadryl as needed

## 2018-08-15 NOTE — Progress Notes (Signed)
Reviewed and agree with assessment/plan.   Tekisha Darcey, MD [facility] 12/28/2015, 12:24 PM Pager:  [phone]  

## 2018-08-25 ENCOUNTER — Other Ambulatory Visit: Payer: Self-pay | Admitting: Family Medicine

## 2018-08-25 DIAGNOSIS — S99911A Unspecified injury of right ankle, initial encounter: Secondary | ICD-10-CM

## 2018-08-26 MED ORDER — NAPROXEN 500 MG PO TBEC: 500.0000 mg | DELAYED_RELEASE_TABLET | Freq: Two times a day (BID) | ORAL | 0 refills | Status: DC

## 2018-08-26 MED ORDER — VARDENAFIL HCL 20 MG PO TABS: 20.0000 mg | ORAL_TABLET | Freq: Every day | ORAL | 0 refills | Status: DC | PRN

## 2018-11-05 ENCOUNTER — Encounter: Payer: Self-pay | Admitting: Family Medicine

## 2018-11-05 ENCOUNTER — Encounter: Payer: 59 | Admitting: Family Medicine

## 2018-11-12 ENCOUNTER — Other Ambulatory Visit: Payer: Self-pay | Admitting: Family Medicine

## 2018-11-12 ENCOUNTER — Encounter: Payer: Self-pay | Admitting: Family Medicine

## 2018-11-12 ENCOUNTER — Ambulatory Visit (INDEPENDENT_AMBULATORY_CARE_PROVIDER_SITE_OTHER): Payer: 59 | Admitting: Family Medicine

## 2018-11-12 ENCOUNTER — Other Ambulatory Visit: Payer: Self-pay

## 2018-11-12 ENCOUNTER — Telehealth: Payer: Self-pay

## 2018-11-12 VITALS — BP 108/72 | HR 82 | Temp 97.5°F | Ht 74.0 in | Wt 279.4 lb

## 2018-11-12 DIAGNOSIS — Z125 Encounter for screening for malignant neoplasm of prostate: Secondary | ICD-10-CM | POA: Diagnosis not present

## 2018-11-12 DIAGNOSIS — R7401 Elevation of levels of liver transaminase levels: Secondary | ICD-10-CM

## 2018-11-12 DIAGNOSIS — N529 Male erectile dysfunction, unspecified: Secondary | ICD-10-CM

## 2018-11-12 DIAGNOSIS — Z23 Encounter for immunization: Secondary | ICD-10-CM | POA: Diagnosis not present

## 2018-11-12 DIAGNOSIS — B360 Pityriasis versicolor: Secondary | ICD-10-CM | POA: Diagnosis not present

## 2018-11-12 DIAGNOSIS — E782 Mixed hyperlipidemia: Secondary | ICD-10-CM | POA: Diagnosis not present

## 2018-11-12 DIAGNOSIS — Z0000 Encounter for general adult medical examination without abnormal findings: Secondary | ICD-10-CM

## 2018-11-12 LAB — CBC
HCT: 42.7 % (ref 39.0–52.0)
Hemoglobin: 14.4 g/dL (ref 13.0–17.0)
MCHC: 33.8 g/dL (ref 30.0–36.0)
MCV: 91.1 fl (ref 78.0–100.0)
Platelets: 208 10*3/uL (ref 150.0–400.0)
RBC: 4.69 Mil/uL (ref 4.22–5.81)
RDW: 13.8 % (ref 11.5–15.5)
WBC: 6.8 10*3/uL (ref 4.0–10.5)

## 2018-11-12 LAB — COMPREHENSIVE METABOLIC PANEL
ALT: 62 U/L — ABNORMAL HIGH (ref 0–53)
AST: 33 U/L (ref 0–37)
Albumin: 4.4 g/dL (ref 3.5–5.2)
Alkaline Phosphatase: 58 U/L (ref 39–117)
BUN: 17 mg/dL (ref 6–23)
CO2: 31 mEq/L (ref 19–32)
Calcium: 9.7 mg/dL (ref 8.4–10.5)
Chloride: 103 mEq/L (ref 96–112)
Creatinine, Ser: 1.14 mg/dL (ref 0.40–1.50)
GFR: 81.68 mL/min (ref >60.00)
Glucose, Bld: 101 mg/dL — ABNORMAL HIGH (ref 70–99)
Potassium: 3.8 mEq/L (ref 3.5–5.1)
Sodium: 139 mEq/L (ref 135–145)
Total Bilirubin: 0.5 mg/dL (ref 0.2–1.2)
Total Protein: 7.2 g/dL (ref 6.0–8.3)

## 2018-11-12 LAB — LIPID PANEL
Cholesterol: 211 mg/dL — ABNORMAL HIGH (ref 0–200)
HDL: 37.3 mg/dL — ABNORMAL LOW (ref >39.00)
NonHDL: 173.5
Total CHOL/HDL Ratio: 6
Triglycerides: 296 mg/dL — ABNORMAL HIGH (ref 0.0–149.0)
VLDL: 59.2 mg/dL — ABNORMAL HIGH (ref 0.0–40.0)

## 2018-11-12 LAB — PSA: PSA: 0.62 ng/mL (ref 0.10–4.00)

## 2018-11-12 LAB — LDL CHOLESTEROL, DIRECT: Direct LDL: 116 mg/dL

## 2018-11-12 MED ORDER — ATORVASTATIN CALCIUM 40 MG PO TABS: 40.0000 mg | ORAL_TABLET | Freq: Every day | ORAL | 3 refills | Status: DC

## 2018-11-12 MED ORDER — KETOCONAZOLE 2 % EX SHAM: MEDICATED_SHAMPOO | Freq: Every day | CUTANEOUS | 3 refills | Status: DC

## 2018-11-12 MED ORDER — SILDENAFIL CITRATE 100 MG PO TABS: 50.0000 mg | ORAL_TABLET | Freq: Every day | ORAL | 2 refills | Status: DC | PRN

## 2018-11-12 NOTE — Patient Instructions (Addendum)

## 2018-11-12 NOTE — Telephone Encounter (Signed)
PA initiated via Covermymeds; KEY: [id number]. Awaiting determination.

## 2018-11-12 NOTE — Progress Notes (Signed)
Chief Complaint  Patient presents with  . Annual Exam    Well Male Adrian Schmidt is here for a complete physical.   His last physical was >1 year ago.  Current diet: in general, a "healthy" diet.  Current exercise: walking, cycling Weight trend: has gained a little Daytime fatigue? Some. Seat belt? Yes.    Health maintenance Shingrix- No Colonoscopy- Yes Tetanus- No HIV- Yes   Hyperlipidemia Patient presents for dyslipidemia follow up. Currently being treated with left 40 mg daily and compliance with treatment thus far has been poor as he has not been taking it. He denies myalgias. He is adhering to a healthy diet. Exercise: Walking, cycling The patient is not known to have coexisting coronary artery disease.  Hx of tinea versicolor.  He would use ketoconazole shampoo for 3 straight days as needed.  No current rashes now.  No adverse effects of the medication.  The patient has a history of erectile dysfunction.  He was prescribed Levitra but stated it flared his reflux.  He has never been on anything else.  The medicine was affordable.  No other adverse reactions described.  Past Medical History:  Diagnosis Date  . Allergy   . Atypical chest pain    negative stress and 2 D echo 2006  . Erectile dysfunction   . GERD (gastroesophageal reflux disease)   . Herpes genitalia   . Hyperlipidemia   . Low testosterone    normal iron, LH, FSH and estradiol  . Sleep apnea    CPAP      Past Surgical History:  Procedure Laterality Date  . SMALL INTESTINE SURGERY     stretching of small intestine-- ring removal. Dr. Diona Fanti    Medications  Current Outpatient Medications on File Prior to Visit  Medication Sig Dispense Refill  . aspirin EC 81 MG tablet Take 81 mg by mouth daily.    Marland Kitchen ketoconazole (NIZORAL) 2 % shampoo Apply topically daily. Apply daily for 3 days 120 mL 3  . Multiple Vitamin (MULTIVITAMIN) tablet Take 1 tablet by mouth daily.    Marland Kitchen OVER THE COUNTER  MEDICATION Bayer Body and Back 500 mg. One capsule as needed.    . vardenafil (LEVITRA) 20 MG tablet Take 1 tablet (20 mg total) by mouth daily as needed for erectile dysfunction. 10 tablet 0  . atorvastatin (LIPITOR) 40 MG tablet Take 1 tablet (40 mg total) by mouth daily. (Patient not taking: Reported on 11/12/2018) 90 tablet 3  . fluticasone (FLONASE) 50 MCG/ACT nasal spray Place 2 sprays into both nostrils as needed for allergies or rhinitis. (Patient not taking: Reported on 11/12/2018) 16 g 5    Allergies No Known Allergies  Family History Family History  Problem Relation Age of Onset  . Cancer Maternal Grandfather        prostate and colon  . Colon cancer Maternal Grandfather   . Diabetes Maternal Grandmother   . Heart disease Maternal Grandmother        CAD  . Esophageal cancer Neg Hx   . Rectal cancer Neg Hx   . Stomach cancer Neg Hx     Review of Systems: Constitutional:  no fevers Eye:  no recent significant change in vision Ear/Nose/Mouth/Throat:  Ears:  no hearing loss Nose/Mouth/Throat:  no complaints of nasal congestion, no sore throat Cardiovascular:  no chest pain, no palpitations Respiratory:  no cough and no shortness of breath Gastrointestinal:  no abdominal pain, no change in bowel habits GU:  Male:  negative for dysuria, frequency, and incontinence and negative for prostate symptoms; +ED Musculoskeletal/Extremities:  no pain, redness, or swelling of the joints Integumentary (Skin/Breast):  no abnormal skin lesions reported Neurologic:  no headaches Endocrine: No unexpected weight changes Hematologic/Lymphatic:  no abnormal bleeding  Exam BP 108/72 (BP Location: Left Arm, Patient Position: Sitting, Cuff Size: Large)   Pulse 82   Temp (!) 97.5 F (36.4 C) (Temporal)   Ht 6' 2"  (1.88 m)   Wt 279 lb 6 oz (126.7 kg)   SpO2 96%   BMI 35.87 kg/m  General:  well developed, well nourished, in no apparent distress Skin:  no significant moles, warts, or  growths Head:  no masses, lesions, or tenderness Eyes:  pupils equal and round, sclera anicteric without injection Ears:  canals without lesions, TMs shiny without retraction, no obvious effusion, no erythema Nose:  nares patent, septum midline, mucosa normal Throat/Pharynx:  lips and gingiva without lesion; tongue and uvula midline; non-inflamed pharynx; no exudates or postnasal drainage Neck: neck supple without adenopathy, thyromegaly, or masses Lungs:  clear to auscultation, breath sounds equal bilaterally, no respiratory distress Rectal: Deferred Musculoskeletal:  symmetrical muscle groups noted without atrophy or deformity Extremities:  no clubbing, cyanosis, or edema, no deformities, no skin discoloration Neuro:  gait normal; deep tendon reflexes normal and symmetric Psych: well oriented with normal range of affect and appropriate judgment/insight  Assessment and Plan  Well adult exam - Plan: CBC, Comp Met (CMET), Lipid Profile  Screening for prostate cancer - Plan: PSA  Tinea versicolor - Plan: ketoconazole (NIZORAL) 2 % shampoo  Need for tetanus booster - Plan: Tdap vaccine  Erectile dysfunction, unspecified erectile dysfunction type - Plan: sildenafil (VIAGRA) 100 MG tablet  Mixed dyslipidemia   Well 53 y.o. male. Counseled on diet and exercise. Counseled on risks and benefits of prostate cancer screening with PSA. The patient agrees to undergo testing. Immunizations, labs, and further orders as above. May need to call in Lipitor again depending on his results. Information on Shingrix provided. Follow up in 1 yr. The patient voiced understanding and agreement to the plan.  [place], DO 11/12/18 10:22 AM

## 2018-11-13 NOTE — Telephone Encounter (Signed)
PA approved.   Request Reference Number: [id number]. SILDENAFIL TAB 100MG  is approved through 11/12/2019. For further questions, call [phone]

## 2018-11-24 ENCOUNTER — Other Ambulatory Visit: Payer: Self-pay | Admitting: Family Medicine

## 2018-11-24 ENCOUNTER — Other Ambulatory Visit: Payer: Self-pay

## 2018-11-24 ENCOUNTER — Other Ambulatory Visit (INDEPENDENT_AMBULATORY_CARE_PROVIDER_SITE_OTHER): Payer: 59

## 2018-11-24 DIAGNOSIS — R7401 Elevation of levels of liver transaminase levels: Secondary | ICD-10-CM | POA: Diagnosis not present

## 2018-11-24 LAB — HEPATIC FUNCTION PANEL
ALT: 76 U/L — ABNORMAL HIGH (ref 0–53)
AST: 36 U/L (ref 0–37)
Albumin: 4 g/dL (ref 3.5–5.2)
Alkaline Phosphatase: 58 U/L (ref 39–117)
Bilirubin, Direct: 0.1 mg/dL (ref 0.0–0.3)
Total Bilirubin: 0.5 mg/dL (ref 0.2–1.2)
Total Protein: 7.1 g/dL (ref 6.0–8.3)

## 2018-11-25 ENCOUNTER — Telehealth: Payer: Self-pay | Admitting: Family Medicine

## 2018-11-25 NOTE — Telephone Encounter (Signed)
Copied from [place] [id number]. Topic: General - Other >> Nov 25, 2018 12:35 PM Keene Breath wrote: Reason for CRM: Patient is returning a call to [place].  Please call patient back at [phone]   See result notes

## 2018-12-04 ENCOUNTER — Emergency Department (HOSPITAL_BASED_OUTPATIENT_CLINIC_OR_DEPARTMENT_OTHER)
Admission: EM | Admit: 2018-12-04 | Discharge: 2018-12-04 | Disposition: A | Discharge status: Home / Self Care | Payer: 59 | Attending: Emergency Medicine | Admitting: Emergency Medicine

## 2018-12-04 ENCOUNTER — Encounter (HOSPITAL_BASED_OUTPATIENT_CLINIC_OR_DEPARTMENT_OTHER): Payer: Self-pay | Admitting: *Deleted

## 2018-12-04 ENCOUNTER — Emergency Department (HOSPITAL_BASED_OUTPATIENT_CLINIC_OR_DEPARTMENT_OTHER): Payer: 59

## 2018-12-04 ENCOUNTER — Other Ambulatory Visit: Payer: Self-pay

## 2018-12-04 DIAGNOSIS — S8991XA Unspecified injury of right lower leg, initial encounter: Secondary | ICD-10-CM

## 2018-12-04 DIAGNOSIS — Y990 Civilian activity done for income or pay: Secondary | ICD-10-CM | POA: Insufficient documentation

## 2018-12-04 DIAGNOSIS — Y929 Unspecified place or not applicable: Secondary | ICD-10-CM | POA: Insufficient documentation

## 2018-12-04 DIAGNOSIS — Y939 Activity, unspecified: Secondary | ICD-10-CM | POA: Diagnosis not present

## 2018-12-04 DIAGNOSIS — S9781XA Crushing injury of right foot, initial encounter: Secondary | ICD-10-CM | POA: Insufficient documentation

## 2018-12-04 DIAGNOSIS — M25561 Pain in right knee: Secondary | ICD-10-CM | POA: Insufficient documentation

## 2018-12-04 DIAGNOSIS — E785 Hyperlipidemia, unspecified: Secondary | ICD-10-CM | POA: Insufficient documentation

## 2018-12-04 DIAGNOSIS — Z79899 Other long term (current) drug therapy: Secondary | ICD-10-CM | POA: Diagnosis not present

## 2018-12-04 DIAGNOSIS — Z7982 Long term (current) use of aspirin: Secondary | ICD-10-CM | POA: Diagnosis not present

## 2018-12-04 MED ORDER — NAPROXEN 250 MG PO TABS
500.0000 mg | ORAL_TABLET | Freq: Once | ORAL | Status: AC
  Administered 2018-12-04: 500 mg via ORAL
  Filled 2018-12-04: qty 2

## 2018-12-04 MED ORDER — NAPROXEN 375 MG PO TABS: ORAL_TABLET | ORAL | 0 refills | Status: DC

## 2018-12-04 NOTE — ED Triage Notes (Signed)
Pt c/o right knee and  leg injury at work x 8 hrs ago

## 2018-12-04 NOTE — ED Provider Notes (Signed)
[facility] Provider Note: Georgena Spurling, MD, FACEP  CSN: [id number] MRN: [id number] ARRIVAL: 12/04/18 at [facility]: [place]  Leg Injury   HISTORY OF PRESENT ILLNESS  12/04/18 1:11 AM Adrian Schmidt is a 53 y.o. male who had a forklift run over his right foot at work yesterday evening about 5 PM.  He is now having pain in his right foot and right knee.  Pain is moderate and worse with weightbearing.  He also feels a clicking sensation in his right knee.  There is minimal associated swelling.  He denies other injury.  He recently had a tetanus booster.  He has not taken anything for his symptoms.   Past Medical History:  Diagnosis Date  . Allergy   . Atypical chest pain    negative stress and 2 D echo 2006  . Erectile dysfunction   . GERD (gastroesophageal reflux disease)   . Herpes genitalia   . Hyperlipidemia   . Low testosterone    normal iron, LH, FSH and estradiol  . Sleep apnea    CPAP    Past Surgical History:  Procedure Laterality Date  . SMALL INTESTINE SURGERY     stretching of small intestine-- ring removal. Dr. Diona Fanti    Family History  Problem Relation Age of Onset  . Cancer Maternal Grandfather        prostate and colon  . Colon cancer Maternal Grandfather   . Diabetes Maternal Grandmother   . Heart disease Maternal Grandmother        CAD  . Esophageal cancer Neg Hx   . Rectal cancer Neg Hx   . Stomach cancer Neg Hx     Social History   Tobacco Use  . Smoking status: Never Smoker  . Smokeless tobacco: Never Used  Substance Use Topics  . Alcohol use: Yes    Comment: socially  . Drug use: No    Prior to Admission medications   Medication Sig Start Date End Date Taking? Authorizing Provider  aspirin EC 81 MG tablet Take 81 mg by mouth daily.    [provider]  atorvastatin (LIPITOR) 40 MG tablet Take 1 tablet (40 mg total) by mouth daily. 11/12/18   Shelda Pal, DO  ketoconazole  (NIZORAL) 2 % shampoo Apply topically daily. Apply daily for 3 days 11/12/18   Shelda Pal, DO  Multiple Vitamin (MULTIVITAMIN) tablet Take 1 tablet by mouth daily.    [provider]  naproxen (NAPROSYN) 375 MG tablet Take 1 tablet twice daily as needed for pain. 12/04/18   Rut Betterton, MD  OVER THE COUNTER MEDICATION Bayer Body and Back 500 mg. One capsule as needed.    [provider]  sildenafil (VIAGRA) 100 MG tablet Take 0.5-1 tablets (50-100 mg total) by mouth daily as needed for erectile dysfunction. 11/12/18   Shelda Pal, DO    Allergies Patient has no known allergies.   REVIEW OF SYSTEMS  Negative except as noted here or in the History of Present Illness.   PHYSICAL EXAMINATION  Initial Vital Signs Blood pressure (!) 146/96, pulse 89, temperature 98.9 F (37.2 C), temperature source Oral, resp. rate 16, height 6\' 2"  (1.88 m), weight 127 kg, SpO2 98 %.  Examination General: Well-developed, well-nourished male in no acute distress; appearance consistent with age of record HENT: normocephalic; atraumatic Eyes: Normal appearance Neck: supple Heart: regular rate and rhythm Lungs: clear to auscultation bilaterally Abdomen: soft; nondistended; nontender; bowel  sounds present Extremities: No deformity; full range of motion; pulses normal; right knee stable without swelling or external tenderness, pain on weightbearing; mild tenderness right foot with mild swelling and no ecchymosis; no tenderness or swelling of right ankle Neurologic: Awake, alert and oriented; motor function intact in all extremities and symmetric; no facial droop Skin: Warm and dry Psychiatric: Normal mood and affect   RESULTS  Summary of this visit's results, reviewed and interpreted by myself:   EKG Interpretation  Date/Time:    Ventricular Rate:    PR Interval:    QRS Duration:   QT Interval:    QTC Calculation:   R Axis:     Text Interpretation:         Laboratory Studies: No results found for this or any previous visit (from the past 24 hour(s)). Imaging Studies: Dg Knee Complete 4 Views Right  Result Date: 12/04/2018 CLINICAL DATA:  Ran over by forklift EXAM: RIGHT KNEE - COMPLETE 4+ VIEW COMPARISON:  None. FINDINGS: No acute bony abnormality. Specifically, no fracture, subluxation, or dislocation. No joint effusion. Irregularity at the anterior tibial tubercle likely related to old injury. IMPRESSION: No acute bony abnormality. Electronically Signed   By: Rolm Baptise M.D.   On: 12/04/2018 01:47   Dg Foot Complete Right  Result Date: 12/04/2018 CLINICAL DATA:  Ran over by forklift. EXAM: RIGHT FOOT COMPLETE - 3+ VIEW FINDINGS: Soft tissue swelling along the dorsum of the foot. No acute bony abnormality. Specifically, no fracture, subluxation, or dislocation. Well corticated bone fragments adjacent to the distal 1st metatarsal near the 1st MTP joint, likely related to old injury. IMPRESSION: No acute bony abnormality. Electronically Signed   By: Rolm Baptise M.D.   On: 12/04/2018 01:46    ED COURSE and MDM  Nursing notes, initial and subsequent vitals signs, including pulse oximetry, reviewed and interpreted by myself.  Vitals:   12/04/18 0107 12/04/18 0110  BP:  (!) 146/96  Pulse: 89   Resp: 16   Temp: 98.9 F (37.2 C)   TempSrc: Oral   SpO2: 98%   Weight: 127 kg   Height: 6\' 2"  (1.88 m)    Medications  naproxen (NAPROSYN) tablet 500 mg (500 mg Oral Given 12/04/18 0121)   No radiographic evidence of significant bony injury.  Will provide patient with a knee sleeve for comfort.  PROCEDURES  Procedures   ED DIAGNOSES     ICD-10-CM   1. Accident caused by forklift, initial encounter  V83.9XXA   2. Injury of right knee, initial encounter  S89.91XA   3. Crush injury of right foot, initial encounter  [id number]        Shanon Rosser, MD 12/04/18 [phone]

## 2019-02-25 ENCOUNTER — Other Ambulatory Visit: Payer: 59

## 2019-02-27 ENCOUNTER — Encounter: Payer: Self-pay | Admitting: Family Medicine

## 2019-03-03 ENCOUNTER — Ambulatory Visit (INDEPENDENT_AMBULATORY_CARE_PROVIDER_SITE_OTHER): Payer: 59 | Admitting: Cardiology

## 2019-03-03 ENCOUNTER — Other Ambulatory Visit: Payer: Self-pay

## 2019-03-03 ENCOUNTER — Ambulatory Visit (INDEPENDENT_AMBULATORY_CARE_PROVIDER_SITE_OTHER): Payer: 59

## 2019-03-03 ENCOUNTER — Encounter: Payer: Self-pay | Admitting: Cardiology

## 2019-03-03 VITALS — BP 108/80 | HR 70 | Ht 74.0 in | Wt 277.0 lb

## 2019-03-03 DIAGNOSIS — R002 Palpitations: Secondary | ICD-10-CM

## 2019-03-03 DIAGNOSIS — G4733 Obstructive sleep apnea (adult) (pediatric): Secondary | ICD-10-CM

## 2019-03-03 DIAGNOSIS — E782 Mixed hyperlipidemia: Secondary | ICD-10-CM

## 2019-03-03 DIAGNOSIS — N529 Male erectile dysfunction, unspecified: Secondary | ICD-10-CM

## 2019-03-03 HISTORY — DX: Palpitations: R00.2

## 2019-03-03 NOTE — Patient Instructions (Addendum)
Medication Instructions:  No medication changes *If you need a refill on your cardiac medications before your next appointment, please call your pharmacy*   Lab Work: No labs ordered If you have labs (blood work) drawn today and your tests are completely normal, you will receive your results only by: Marland Kitchen MyChart Message (if you have MyChart) OR . A paper copy in the mail If you have any lab test that is abnormal or we need to change your treatment, we will call you to review the results.   Testing/Procedures: CT-scan of the chest  Your physician has recommended that you wear a Zio monitor. Zio monitors are medical devices that record the heart's electrical activity. Doctors most often [place] these monitors to diagnose arrhythmias. Arrhythmias are problems with the speed or rhythm of the heartbeat. The monitor is a small, portable device. You can wear one while you do your normal daily activities. This is usually used to diagnose what is causing palpitations/syncope (passing out).    Follow-Up: At [facility], you and your health needs are our priority.  As part of our continuing mission to provide you with exceptional heart care, we have created designated Provider Care Teams.  These Care Teams include your primary Cardiologist (physician) and Advanced Practice Providers (APPs -  Physician Assistants and Nurse Practitioners) who all work together to provide you with the care you need, when you need it.  We recommend signing up for the patient portal called "MyChart".  Sign up information is provided on this After Visit Summary.  MyChart is used to connect with patients for Virtual Visits (Telemedicine).  Patients are able to view lab/test results, encounter notes, upcoming appointments, etc.  Non-urgent messages can be sent to your provider as well.   To learn more about what you can do with MyChart, go to [email or web address].    Your next appointment:   1 month(s)  The format for your  next appointment:   In Person  Provider:   Jyl Heinz, MD   Other Instructions  Coronary Calcium Scan A coronary calcium scan is an imaging test used to look for deposits of plaque in the inner lining of the blood vessels of the heart (coronary arteries). Plaque is made up of calcium, protein, and fatty substances. These deposits of plaque can partly clog and narrow the coronary arteries without producing any symptoms or warning signs. This puts a person at risk for a heart attack. This test is recommended for people who are at moderate risk for heart disease. The test can find plaque deposits before symptoms develop. Tell a health care provider about:  Any allergies you have.  All medicines you are taking, including vitamins, herbs, eye drops, creams, and over-the-counter medicines.  Any problems you or family members have had with anesthetic medicines.  Any blood disorders you have.  Any surgeries you have had.  Any medical conditions you have.  Whether you are pregnant or may be pregnant. What are the risks? Generally, this is a safe procedure. However, problems may occur, including:  Harm to a pregnant woman and her unborn baby. This test involves the use of radiation. Radiation exposure can be dangerous to a pregnant woman and her unborn baby. If you are pregnant or think you may be pregnant, you should not have this procedure done.  Slight increase in the risk of cancer. This is because of the radiation involved in the test. What happens before the procedure? Ask your health care provider for any specific  instructions on how to prepare for this procedure. You may be asked to avoid products that contain caffeine, tobacco, or nicotine for 4 hours before the procedure. What happens during the procedure?   You will undress and remove any jewelry from your neck or chest.  You will put on a hospital gown.  Sticky electrodes will be placed on your chest. The electrodes  will be connected to an electrocardiogram (ECG) machine to record a tracing of the electrical activity of your heart.  You will lie down on a curved bed that is attached to the [place].  You may be given medicine to slow down your heart rate so that clear pictures can be created.  You will be moved into the CT scanner, and the CT scanner will take pictures of your heart. During this time, you will be asked to lie still and hold your breath for 2-3 seconds at a time while each picture of your heart is being taken. The procedure may vary among health care providers and hospitals. What happens after the procedure?  You can get dressed.  You can return to your normal activities.  It is up to you to get the results of your procedure. Ask your health care provider, or the department that is doing the procedure, when your results will be ready. Summary  A coronary calcium scan is an imaging test used to look for deposits of plaque in the inner lining of the blood vessels of the heart (coronary arteries). Plaque is made up of calcium, protein, and fatty substances.  Generally, this is a safe procedure. Tell your health care provider if you are pregnant or may be pregnant.  Ask your health care provider for any specific instructions on how to prepare for this procedure.  A CT scanner will take pictures of your heart.  You can return to your normal activities after the scan is done. This information is not intended to replace advice given to you by your health care provider. Make sure you discuss any questions you have with your health care provider. Document Revised: 07/08/2018 Document Reviewed: 07/08/2018 Elsevier Patient Education  [place].  We will order CT coronary calcium score  $150  Please call [phone] to schedule    [facility]  [address] [place]  [place], [place] [address]

## 2019-03-03 NOTE — Progress Notes (Signed)
Cardiology Office Note:    Date:  03/03/2019   ID:  Adrian Schmidt, Adrian Schmidt 1965-11-16, MRN [id number]  PCP:  Adrian Pal, DO  Cardiologist:  Adrian Lindau, MD   Referring MD: Adrian Schmidt*    ASSESSMENT:    1. Mixed dyslipidemia   2. Erectile dysfunction, unspecified erectile dysfunction type   3. OSA (obstructive sleep apnea)   4. Palpitations    PLAN:    In order of problems listed above:  1. Primary prevention stressed with the patient.  Importance of compliance with diet and medication stressed and he vocalized understanding.  His blood pressure is stable.  He is concerned about his risk factors and issues with cardiac health.  In view of this and his risk factors I have suggested him a CT calcium scoring and he is agreeable.  He is going to get blood work done in the next few days by his primary care physician including lipids.  Importance of regular exercise and weight reduction stressed to him at extensive length and he vocalized understanding and promises to comply I will review his lipids when I see him in follow-up appointment in a month. 2. Palpitations: We will do a 2-week ZIO monitoring on this patient.  We will also let him know to get his TSH checked when he get his blood work done 3. He be seen in follow-up appointment in a month or earlier if he has any concerns.   Medication Adjustments/Labs and Tests Ordered: Current medicines are reviewed at length with the patient today.  Concerns regarding medicines are outlined above.  No orders of the defined types were placed in this encounter.  No orders of the defined types were placed in this encounter.    Chief Complaint  Patient presents with  . Follow-up     History of Present Illness:    Adrian Schmidt is a 54 y.o. male.  Patient has past medical history of mixed dyslipidemia.  He was evaluated for chest discomfort and this test was negative.  He now gives history of palpitations.   This happened on and off.  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  No chest pain orthopnea or PND.  He leads a sedentary lifestyle.  He is now on statin therapy.  At the time of my evaluation, the patient is alert awake oriented and in no distress.  Past Medical History:  Diagnosis Date  . Allergy   . Atypical chest pain    negative stress and 2 D echo 2006  . Erectile dysfunction   . GERD (gastroesophageal reflux disease)   . Herpes genitalia   . Hyperlipidemia   . Low testosterone    normal iron, LH, FSH and estradiol  . Sleep apnea    CPAP    Past Surgical History:  Procedure Laterality Date  . SMALL INTESTINE SURGERY     stretching of small intestine-- ring removal. Dr. Diona Fanti    Current Medications: Current Meds  Medication Sig  . Multiple Vitamin (MULTIVITAMIN) tablet Take 1 tablet by mouth daily.     Allergies:   Patient has no known allergies.   Social History   Socioeconomic History  . Marital status: Single    Spouse name: Not on file  . Number of children: 2  . Years of education: Not on file  . Highest education level: Not on file  Occupational History  . Occupation: Futures trader: [employer]  Tobacco Use  .  Smoking status: Never Smoker  . Smokeless tobacco: Never Used  Substance and Sexual Activity  . Alcohol use: Yes    Comment: socially  . Drug use: No  . Sexual activity: Not on file  Other Topics Concern  . Not on file  Social History Narrative   Regular Exercise:  yes   Social Determinants of Health   Financial Resource Strain:   . Difficulty of Paying Living Expenses: Not on file  Food Insecurity:   . Worried About Charity fundraiser in the Last Year: Not on file  . Ran Out of Food in the Last Year: Not on file  Transportation Needs:   . Lack of Transportation (Medical): Not on file  . Lack of Transportation (Non-Medical): Not on file  Physical Activity:   . Days of Exercise per Week: Not on file  . Minutes of Exercise per  Session: Not on file  Stress:   . Feeling of Stress : Not on file  Social Connections:   . Frequency of Communication with Friends and Family: Not on file  . Frequency of Social Gatherings with Friends and Family: Not on file  . Attends Religious Services: Not on file  . Active Member of Clubs or Organizations: Not on file  . Attends Archivist Meetings: Not on file  . Marital Status: Not on file     Family History: The patient's family history includes Cancer in his maternal grandfather; Colon cancer in his maternal grandfather; Diabetes in his maternal grandmother; Heart disease in his maternal grandmother. There is no history of Esophageal cancer, Rectal cancer, or Stomach cancer.  ROS:   Please see the history of present illness.    All other systems reviewed and are negative.  EKGs/Labs/Other Studies Reviewed:    The following studies were reviewed today: EKG reveals sinus rhythm and nonspecific ST-T changes   Recent Labs: 11/12/2018: BUN 17; Creatinine, Ser 1.14; Hemoglobin 14.4; Platelets 208.0; Potassium 3.8; Sodium 139 11/24/2018: ALT 76  Recent Lipid Panel    Component Value Date/Time   CHOL 211 (H) 11/12/2018 1021   TRIG 296.0 (H) 11/12/2018 1021   HDL 37.30 (L) 11/12/2018 1021   CHOLHDL 6 11/12/2018 1021   VLDL 59.2 (H) 11/12/2018 1021   LDLCALC 134 (H) 04/18/2010 1224   LDLDIRECT 116.0 11/12/2018 1021    Physical Exam:    VS:  BP 108/80   Pulse 70   Ht 6\' 2"  (1.88 m)   Wt 277 lb (125.6 kg)   SpO2 97%   BMI 35.56 kg/m     Wt Readings from Last 3 Encounters:  03/03/19 277 lb (125.6 kg)  12/04/18 280 lb (127 kg)  11/12/18 279 lb 6 oz (126.7 kg)     GEN: Patient is in no acute distress HEENT: Normal NECK: No JVD; No carotid bruits LYMPHATICS: No lymphadenopathy CARDIAC: Hear sounds regular, 2/6 systolic murmur at the apex. RESPIRATORY:  Clear to auscultation without rales, wheezing or rhonchi  ABDOMEN: Soft, non-tender,  non-distended MUSCULOSKELETAL:  No edema; No deformity  SKIN: Warm and dry NEUROLOGIC:  Alert and oriented x 3 PSYCHIATRIC:  Normal affect   Signed, Adrian Lindau, MD  03/03/2019 8:58 AM    [place]

## 2019-03-03 NOTE — Addendum Note (Signed)
Addended by: Caffie Pinto on: 03/03/2019 02:34 PM   Modules accepted: Orders

## 2019-03-04 ENCOUNTER — Other Ambulatory Visit (INDEPENDENT_AMBULATORY_CARE_PROVIDER_SITE_OTHER): Payer: 59

## 2019-03-04 ENCOUNTER — Other Ambulatory Visit: Payer: Self-pay

## 2019-03-04 DIAGNOSIS — R7401 Elevation of levels of liver transaminase levels: Secondary | ICD-10-CM | POA: Diagnosis not present

## 2019-03-04 LAB — IBC + FERRITIN
Ferritin: 412.6 ng/mL — ABNORMAL HIGH (ref 22.0–322.0)
Iron: 67 ug/dL (ref 42–165)
Saturation Ratios: 22.3 % (ref 20.0–50.0)
Transferrin: 215 mg/dL (ref 212.0–360.0)

## 2019-03-05 ENCOUNTER — Encounter: Payer: Self-pay | Admitting: Family Medicine

## 2019-03-05 LAB — HEPATITIS C ANTIBODY
Hepatitis C Ab: NONREACTIVE
SIGNAL TO CUT-OFF: 0.01 (ref <1.00)

## 2019-03-05 LAB — HEPATITIS B SURFACE ANTIGEN: Hepatitis B Surface Ag: NONREACTIVE

## 2019-03-06 ENCOUNTER — Other Ambulatory Visit: Payer: Self-pay | Admitting: Family Medicine

## 2019-03-06 DIAGNOSIS — R7989 Other specified abnormal findings of blood chemistry: Secondary | ICD-10-CM

## 2019-03-06 NOTE — Progress Notes (Signed)
ferrit

## 2019-03-09 ENCOUNTER — Telehealth: Payer: Self-pay

## 2019-03-09 NOTE — Telephone Encounter (Signed)
PA denied. Coverage only if Pt has organic cause of ED (examples: diabetes, hypertension, neurological disorder such as: seizure disorder, spinal cord injury, atherosclerosis, drug induced hypercholesterolemia, renal insufficiency, endocrine disorder such as hypogonadism, or history of male genital surgery (prostatectomy).

## 2019-03-09 NOTE — Telephone Encounter (Signed)
PA initiated via Covermymeds; KEY: [id number]. Awaiting determination.

## 2019-03-10 ENCOUNTER — Encounter: Payer: Self-pay | Admitting: Family Medicine

## 2019-03-13 ENCOUNTER — Other Ambulatory Visit: Payer: Self-pay

## 2019-03-13 ENCOUNTER — Encounter: Payer: Self-pay | Admitting: Adult Health

## 2019-03-13 ENCOUNTER — Ambulatory Visit (INDEPENDENT_AMBULATORY_CARE_PROVIDER_SITE_OTHER): Payer: 59 | Admitting: Adult Health

## 2019-03-13 DIAGNOSIS — G478 Other sleep disorders: Secondary | ICD-10-CM

## 2019-03-13 DIAGNOSIS — G4733 Obstructive sleep apnea (adult) (pediatric): Secondary | ICD-10-CM | POA: Diagnosis not present

## 2019-03-13 HISTORY — DX: Other sleep disorders: G47.8

## 2019-03-13 NOTE — Patient Instructions (Addendum)
Continue on CPAP At bedtime  , try to wear each night .  Wear each night, try to get at least 4-6 hours. Adjust CPAP to 10 to 16cmH2O.  CPAP download in 1 month  Work on health weight .  Do not drive if sleepy Follow up with Dr. Halford Chessman  In 6 months and As needed   Please contact office for sooner follow up if symptoms do not improve or worsen or seek emergency care

## 2019-03-13 NOTE — Assessment & Plan Note (Signed)
Isolated episodes of sleep paralysis in patient with poor CPAP compliance and severe underlying sleep apnea.  Seem to only occur during nighttime sleep.  No symptoms for narcolepsy.  I have advised him to avoid sedating medications.  To please wear his CPAP on a more consistent basis.  If continue to recur will need to evaluate further.  Of advised him to contact us if these episodes continue to occur.

## 2019-03-13 NOTE — Assessment & Plan Note (Signed)
Severe obstructive sleep apnea-compliance continues to be an ongoing issue for patient.  Have encouraged him to wear his CPAP each night.  Will adjust CPAP pressure in hopes to decrease number of events and also for comfort. Change CPAP pressure 10 to 16 cm H2O.  Check a CPAP download in 1 month.  Plan  Patient Instructions  Continue on CPAP At bedtime  , try to wear each night .  Wear each night, try to get at least 4-6 hours. Adjust CPAP to 10 to 16cmH2O.  CPAP download in 1 month  Work on health weight .  Do not drive if sleepy Follow up with Dr. Halford Chessman  In 6 months and As needed   Please contact office for sooner follow up if symptoms do not improve or worsen or seek emergency care

## 2019-03-13 NOTE — Progress Notes (Signed)
@Patient  ID: Adrian Schmidt, male    DOB: 1965/01/29, 54 y.o.   MRN: [id number]  Chief Complaint  Patient presents with  . Follow-up    OSA    Referring provider: Shelda Pal*  HPI: 54 year old male followed for severe obstructive sleep apnea  TEST/EVENTS :  HST - 2014 AHI 33  03/13/2019 Follow up : OSA  Patient returns for a follow-up for sleep apnea.  Patient has underlying severe sleep apnea is on nocturnal CPAP.  Patient says he tries to wear his CPAP but does not always wear it every night.  Patient says when he does wear it he feels better..  CPAP download shows 37% compliance with average usage at 5.5 hours.  Patient is on auto CPAP 5 to 15 cm H2O.  AHI 10, minimum leaks.  Average pressure at 13 cm H2O.  Patient says at times his pressure does not feel quite as strong enough. 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  He says he has had this in the past but has noticed it recently.  He says he did have one episode when wearing his CPAP that he was sleeping well but got startled awake and noticed this happened.  He has had no episodes of sleep paralysis during the daytime or falling asleep.  He denies any recent new medications or sedating medicines or narcotic use.  No Known Allergies  Immunization History  Administered Date(s) Administered  . Td 02/24/2008  . Tdap 11/12/2018    Past Medical History:  Diagnosis Date  . Allergy   . Atypical chest pain    negative stress and 2 D echo 2006  . Erectile dysfunction   . GERD (gastroesophageal reflux disease)   . Herpes genitalia   . Hyperlipidemia   . Low testosterone    normal iron, LH, FSH and estradiol  . Sleep apnea    CPAP    Tobacco History: Social History   Tobacco Use  Smoking Status  Never Smoker  Smokeless Tobacco Never Used   Counseling given: Not Answered   Outpatient Medications Prior to Visit  Medication Sig Dispense Refill  . aspirin EC 81 MG tablet Take 81 mg by mouth daily.    Marland Kitchen atorvastatin (LIPITOR) 40 MG tablet Take 1 tablet (40 mg total) by mouth daily. 90 tablet 3  . Multiple Vitamin (MULTIVITAMIN) tablet Take 1 tablet by mouth daily.    . sildenafil (VIAGRA) 100 MG tablet Take 0.5-1 tablets (50-100 mg total) by mouth daily as needed for erectile dysfunction. 10 tablet 2  . vardenafil (LEVITRA) 20 MG tablet TAKE 1 TABLET BY MOUTH EVERY DAY AS NEEDED FOR ERECTILE DYSFUNCTION 10 tablet 0   No facility-administered medications prior to visit.     Review of Systems:   Constitutional:   No  weight loss, night sweats,  Fevers, chills, fatigue, or  lassitude.  HEENT:   No headaches,  Difficulty swallowing,  Tooth/dental problems, or  Sore throat,                No sneezing, itching, ear ache, nasal congestion, post nasal drip,   CV:  No chest pain,  Orthopnea, PND, swelling in lower extremities, anasarca, dizziness, palpitations, syncope.   GI  No  heartburn, indigestion, abdominal pain, nausea, vomiting, diarrhea, change in bowel habits, loss of appetite, bloody stools.   Resp: No shortness of breath with exertion or at rest.  No excess mucus, no productive cough,  No non-productive cough,  No coughing up of blood.  No change in color of mucus.  No wheezing.  No chest wall deformity  Skin: no rash or lesions.  GU: no dysuria, change in color of urine, no urgency or frequency.  No flank pain, no hematuria   MS:  No joint pain or swelling.  No decreased range of motion.  No back pain.    Physical Exam  BP 110/76 (BP Location: Left Arm, Cuff Size: Large)   Pulse 97   Temp 98.1 F (36.7 C) (Temporal)   Ht 6\' 1"  (1.854 m)   Wt 281 lb 6.4 oz (127.6 kg)   SpO2 98% Comment: RA  BMI 37.13 kg/m   GEN: A/Ox3; pleasant , NAD, well nourished     HEENT:  [place]/AT,  EACs-clear, TMs-wnl, NOSE-clear, THROAT-clear, no lesions, no postnasal drip or exudate noted.   NECK:  Supple w/ fair ROM; no JVD; normal carotid impulses w/o bruits; no thyromegaly or nodules palpated; no lymphadenopathy.    RESP  Clear  P & A; w/o, wheezes/ rales/ or rhonchi. no accessory muscle use, no dullness to percussion  CARD:  RRR, no m/r/g, no peripheral edema, pulses intact, no cyanosis or clubbing.  GI:   Soft & nt; nml bowel sounds; no organomegaly or masses detected.   Musco: Warm bil, no deformities or joint swelling noted.   Neuro: alert, no focal deficits noted.    Skin: Warm, no lesions or rashes    Lab Results:  CBC  BNP No results found for: BNP  ProBNP No results found for: PROBNP  Imaging: No results found.    No flowsheet data found.  No results found for: NITRICOXIDE      Assessment & Plan:   OSA (obstructive sleep apnea) Severe obstructive sleep apnea-compliance continues to be an ongoing issue for patient.  Have encouraged him to wear his CPAP each night.  Will adjust CPAP pressure in hopes to decrease number of events and also for comfort. Change CPAP pressure 10 to 16 cm H2O.  Check a CPAP download in 1 month.  Plan  Patient Instructions  Continue on CPAP At bedtime  , try to wear each night .  Wear each night, try to get at least 4-6 hours. Adjust CPAP to 10 to 16cmH2O.  CPAP download in 1 month  Work on health weight .  Do not drive if sleepy Follow up with Dr. 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  Seem to only occur during nighttime sleep.  No symptoms for narcolepsy.  I have advised him to avoid sedating medications.  To please wear his CPAP on a more consistent basis.  If  continue to recur will need to evaluate further.  Of advised him to contact us if these episodes continue to occur.     Total patient care time 31 minutes  Tammy Parrett, NP 03/13/2019

## 2019-03-15 NOTE — Progress Notes (Signed)
Reviewed and agree with assessment/plan.   Jawann Urbani, MD [facility] 12/28/2015, 12:24 PM Pager:  [phone]  

## 2019-03-16 ENCOUNTER — Ambulatory Visit (INDEPENDENT_AMBULATORY_CARE_PROVIDER_SITE_OTHER)
Admission: RE | Admit: 2019-03-16 | Discharge: 2019-03-16 | Disposition: A | Discharge status: Home / Self Care | Payer: Self-pay | Source: Ambulatory Visit | Attending: Cardiology | Admitting: Cardiology

## 2019-03-16 ENCOUNTER — Other Ambulatory Visit: Payer: Self-pay

## 2019-03-16 DIAGNOSIS — E782 Mixed hyperlipidemia: Secondary | ICD-10-CM

## 2019-03-17 NOTE — Addendum Note (Signed)
Addended by: Parke Poisson E on: 03/17/2019 09:46 AM   Modules accepted: Orders

## 2019-03-19 ENCOUNTER — Ambulatory Visit: Payer: 59 | Attending: Internal Medicine

## 2019-03-19 ENCOUNTER — Encounter: Payer: Self-pay | Admitting: Family Medicine

## 2019-03-19 DIAGNOSIS — Z23 Encounter for immunization: Secondary | ICD-10-CM

## 2019-03-19 NOTE — Progress Notes (Signed)
   Covid-19 Vaccination Clinic  Name:  Adrian Schmidt    MRN: [id number] DOB: 1965/01/20  03/19/2019  Mr. [name] was observed post Covid-19 immunization for 15 minutes without incident. He was provided with Vaccine Information Sheet and instruction to access the V-Safe system.   Mr. [name] was instructed to call 911 with any severe reactions post vaccine: Marland Kitchen Difficulty breathing  . Swelling of face and throat  . A fast heartbeat  . A bad rash all over body  . Dizziness and weakness   Immunizations Administered    Name Date Dose VIS Date Route   Pfizer COVID-19 Vaccine 03/19/2019  8:41 AM 0.3 mL 12/12/2018 Intramuscular   Manufacturer: [place]   Lot: [id number]   [place]: [id number]

## 2019-03-27 ENCOUNTER — Other Ambulatory Visit (INDEPENDENT_AMBULATORY_CARE_PROVIDER_SITE_OTHER): Payer: 59

## 2019-03-27 ENCOUNTER — Other Ambulatory Visit: Payer: Self-pay

## 2019-03-27 DIAGNOSIS — R7989 Other specified abnormal findings of blood chemistry: Secondary | ICD-10-CM

## 2019-03-27 LAB — FERRITIN: Ferritin: 303.9 ng/mL (ref 22.0–322.0)

## 2019-04-01 ENCOUNTER — Other Ambulatory Visit: Payer: Self-pay | Admitting: Family Medicine

## 2019-04-01 MED ORDER — FLUTICASONE PROPIONATE 50 MCG/ACT NA SUSP: 1.0000 | Freq: Every day | NASAL | 1 refills | Status: DC

## 2019-04-10 ENCOUNTER — Ambulatory Visit: Payer: 59 | Admitting: Cardiology

## 2019-04-13 ENCOUNTER — Ambulatory Visit: Payer: 59 | Attending: Internal Medicine

## 2019-04-13 DIAGNOSIS — Z23 Encounter for immunization: Secondary | ICD-10-CM

## 2019-04-13 NOTE — Progress Notes (Signed)
   Covid-19 Vaccination Clinic  Name:  Adrian Schmidt    MRN: [id number] DOB: 1965/08/02  04/13/2019  Mr. Adrian Schmidt was observed post Covid-19 immunization for 15 minutes without incident. He was provided with Vaccine Information Sheet and instruction to access the V-Safe system.   Mr. Adrian Schmidt was instructed to call 911 with any severe reactions post vaccine: Marland Kitchen Difficulty breathing  . Swelling of face and throat  . A fast heartbeat  . A bad rash all over body  . Dizziness and weakness   Immunizations Administered    Name Date Dose VIS Date Route   Pfizer COVID-19 Vaccine 04/13/2019  9:45 AM 0.3 mL 12/12/2018 Intramuscular   Manufacturer: [employer], [employer]   Lot: [id number]   [place]: [id number]

## 2019-05-05 ENCOUNTER — Ambulatory Visit: Payer: 59 | Admitting: Cardiology

## 2019-06-03 ENCOUNTER — Other Ambulatory Visit: Payer: Self-pay

## 2019-06-03 ENCOUNTER — Ambulatory Visit (INDEPENDENT_AMBULATORY_CARE_PROVIDER_SITE_OTHER): Payer: 59 | Admitting: Cardiology

## 2019-06-03 ENCOUNTER — Encounter: Payer: Self-pay | Admitting: Cardiology

## 2019-06-03 VITALS — BP 110/80 | HR 68 | Ht 73.0 in | Wt 277.0 lb

## 2019-06-03 DIAGNOSIS — E782 Mixed hyperlipidemia: Secondary | ICD-10-CM

## 2019-06-03 DIAGNOSIS — R5383 Other fatigue: Secondary | ICD-10-CM | POA: Diagnosis not present

## 2019-06-03 MED ORDER — FISH OIL BURP-LESS 1000 MG PO CAPS: 2.0000 g | ORAL_CAPSULE | Freq: Two times a day (BID) | ORAL | 3 refills | Status: DC

## 2019-06-03 NOTE — Patient Instructions (Signed)
Medication Instructions:  Your physician has recommended you make the following change in your medication:   Start taking 2 gm of over the counter fish oil twice daily.  *If you need a refill on your cardiac medications before your next appointment, please call your pharmacy*   Lab Work: Your physician recommends that you return for lab work in: 2 months (August 2,2021). You need to have labs done when you are fasting.  You can come Monday through Friday 8:30 am to 12:00 pm and 1:15 to 4:30. You do not need to make an appointment as the order has already been placed. The labs you are going to have done are BMET, LFT and Lipids.   If you have labs (blood work) drawn today and your tests are completely normal, you will receive your results only by: Marland Kitchen MyChart Message (if you have MyChart) OR . A paper copy in the mail If you have any lab test that is abnormal or we need to change your treatment, we will call you to review the results.   Testing/Procedures: None ordered   Follow-Up: At [facility], you and your health needs are our priority.  As part of our continuing mission to provide you with exceptional heart care, we have created designated Provider Care Teams.  These Care Teams include your primary Cardiologist (physician) and Advanced Practice Providers (APPs -  Physician Assistants and Nurse Practitioners) who all work together to provide you with the care you need, when you need it.  We recommend signing up for the patient portal called "MyChart".  Sign up information is provided on this After Visit Summary.  MyChart is used to connect with patients for Virtual Visits (Telemedicine).  Patients are able to view lab/test results, encounter notes, upcoming appointments, etc.  Non-urgent messages can be sent to your provider as well.   To learn more about what you can do with MyChart, go to [email or web address].    Your next appointment:   6 month(s)  The format for your next  appointment:   In Person  Provider:   Jyl Heinz, MD   Other Instructions Fish Oil, Omega-3 Fatty Acids capsules (OTC) What is this medicine? FISH OIL, OMEGA-3 FATTY ACIDS (Fish Oil, oh MAY ga - 3 fatty AS ids) are essential fats. It is promoted to help support a healthy heart. This dietary supplement is used to add to a healthy diet. The FDA has not approved this supplement for any medical use. This supplement may be used for other purposes; ask your health care provider or pharmacist if you have questions. This medicine may be used for other purposes; ask your health care provider or pharmacist if you have questions. COMMON BRAND NAME(S): Omega-3, Omega-3 Fish Oil, OMEGA-3 IQ DHA, TherOmega, THEROMEGA SPORT What should I tell my health care provider before I take this medicine? They need to know if you have any of these conditions  bleeding problems  lung or breathing disease, like asthma  an unusual or allergic reaction to fish oil, omega-3 fatty acids, fish, other medicines, foods, dyes, or preservatives  pregnant or trying to get pregnant  breast-feeding How should I use this medicine? Take this medicine by mouth with a glass of water. Follow the directions on the package or prescription label. Take with food. Take your medicine at regular intervals. Do not take your medicine more often than directed. Talk to your pediatrician regarding the use of this medicine in children. Special care may be needed. This medicine should  not be used in children without a doctor's advice. Overdosage: If you think you have taken too much of this medicine contact a poison control center or emergency room at once. NOTE: This medicine is only for you. Do not share this medicine with others. What if I miss a dose? If you miss a dose, take it as soon as you can. If it is almost time for your next dose, take only that dose. Do not take double or extra doses. What may interact with this  medicine?  aspirin and aspirin-like medicines  herbal products like danshen, dong quai, garlic pills, ginger, ginkgo biloba, horse chestnut, willow bark, and others  medicines that treat or prevent blood clots like enoxaparin, heparin, warfarin This list may not describe all possible interactions. Give your health care provider a list of all the medicines, herbs, non-prescription drugs, or dietary supplements you use. Also tell them if you smoke, drink alcohol, or use illegal drugs. Some items may interact with your medicine. What should I watch for while using this medicine? Follow a good diet and exercise plan. Taking a dietary supplement does not replace a healthy lifestyle. Some foods that have omega-3 fatty acids naturally are fatty fish like albacore tuna, halibut, herring, mackerel, lake trout, salmon, and sardines. Too much of this supplement can be unsafe. Talk to your doctor or health care provider about how much of this supplement is right for you. If you are scheduled for any medical or dental procedure, tell your healthcare provider that you are taking this medicine. You may need to stop taking this medicine before the procedure. Herbal or dietary supplements are not regulated like medicines. Rigid quality control standards are not required for dietary supplements. The purity and strength of these products can vary. The safety and effect of this dietary supplement for a certain disease or illness is not well known. This product is not intended to diagnose, treat, cure or prevent any disease. The Food and Drug Administration suggests the following to help consumers protect themselves:  Always read product labels and follow directions.  Natural does not mean a product is safe for humans to take.  Look for products that include USP after the ingredient name. This means that the manufacturer followed the standards of the [place] Pharmacopoeia.  Supplements made or sold by a nationally known  food or drug company are more likely to be made under tight controls. You can write to the company for more information about how the product was made. What side effects may I notice from receiving this medicine? Side effects that you should report to your doctor or health care professional as soon as possible:  allergic reactions like skin rash, itching or hives, swelling of the face, lips, or tongue  breathing problems  changes in your moods or emotions  unusual bleeding or bruising Side effects that usually do not require medical attention (report to your doctor or health care professional if they continue or are bothersome):  bad or fishy breath  belching  diarrhea  nausea  stomach gas, upset  weight gain This list may not describe all possible side effects. Call your doctor for medical advice about side effects. You may report side effects to FDA at 1-800-FDA-1088. Where should I keep my medicine? Keep out of the reach of children. Store at room temperature or as directed on the package label. Protect from moisture. Do not freeze. Throw away any unused medicine after the expiration date. NOTE: This sheet is a  summary. It may not cover all possible information. If you have questions about this medicine, talk to your doctor, pharmacist, or health care provider.  2020 Elsevier/Gold Standard (2014-04-08 09:36:32)

## 2019-06-03 NOTE — Progress Notes (Signed)
Cardiology Office Note:    Date:  06/03/2019   ID:  Adrian Schmidt, Adrian Schmidt 10/21/1965, MRN [id number]  PCP:  Shelda Pal, DO  Cardiologist:  Jenean Lindau, MD   Referring MD: Shelda Pal*    ASSESSMENT:    1. Mixed hyperlipidemia   2. Mixed dyslipidemia   3. Other fatigue    PLAN:    In order of problems listed above:  Primary prevention stressed with the patient.  Importance of compliance with diet medication stressed he vocalized understanding.  CT calcium score was 0 and I told him about it and he was very happy.  Advice about exercise was given and he was told to walk at least half an hour a day 5 days a week and he promises to do so. Hypertriglyceridemia: This was discussed with the patient at extensive length.  Diet was emphasized extensively weight reduction was stressed I told him to cut down carbohydrates in the diet.  He will be back in 2 months for a follow-up check.  He was also advised to take fish oil 2 g twice daily. Overweight: I discussed my findings with him at length weight reduction was stressed with exercise and diet Patient will be seen in follow-up appointment in 6 months or earlier if the patient has any concerns   Medication Adjustments/Labs and Tests Ordered: Current medicines are reviewed at length with the patient today.  Concerns regarding medicines are outlined above.  No orders of the defined types were placed in this encounter.  No orders of the defined types were placed in this encounter.    Chief Complaint  Patient presents with  . Follow-up     History of Present Illness:    Adrian Schmidt is a 54 y.o. male.  Patient has past medical history of mixed dyslipidemia.  He is overweight and leads a sedentary lifestyle.  He denies any chest pain orthopnea or PND.  His palpitations have resolved completely and is happy about it.  At the time of my evaluation, the patient is alert awake oriented and in no  distress.  Past Medical History:  Diagnosis Date  . Allergic rhinitis 03/23/2008   Qualifier: Diagnosis of  By: Leonette Nutting of this note might be different from the original. Overview:  Qualifier: Diagnosis of  By: Wynona Luna  . Allergy   . Atypical chest pain    negative stress and 2 D echo 2006  . Chest discomfort 11/15/2017  . Erectile dysfunction   . GERD (gastroesophageal reflux disease)   . Herpes genitalia   . Hyperlipidemia   . HYPOGONADISM 02/25/2008   Qualifier: Diagnosis of  By: Leonette Nutting of this note might be different from the original. Overview:  Qualifier: Diagnosis of  By: Wynona Luna  . Knee mass, left 06/05/2016  . Left knee pain 11/09/2015  . Low testosterone    normal iron, LH, FSH and estradiol  . Mixed dyslipidemia 11/15/2017  . Obesity 02/24/2008   Qualifier: Diagnosis of  By: Leonette Nutting of this note might be different from the original. Overview:  Qualifier: Diagnosis of  By: Wynona Luna  . OSA (obstructive sleep apnea) 11/14/2012   HST 11/2012:  AHI 34/hr.  ?pressure induced centrals by download (AHI still 6/hr on auto 5-20 without significant mask leak) 2016: on auto 5-10cm with significant breakthru.   Formatting of  this note might be different from the original. Overview:  HST 11/2012:  AHI 34/hr.  ?pressure induced centrals by download (AHI still 6/hr on auto 5-20 without significant mask leak) 2016: on auto 5-10cm with   . Other dysphagia 10/07/2009   Qualifier: Diagnosis of  By: Leonette Nutting of this note might be different from the original. Overview:  Qualifier: Diagnosis of  By: Wynona Luna  . Other fatigue 11/01/2017  . Palpitations 03/03/2019  . Preventative health care 04/27/2011   Formatting of this note might be different from the original. Last Assessment & Plan:  Reviewed adult health maintenance protocols.  I encouraged weight loss.  Handout for low  saturated fat diet provided.  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 Overview:  Qualifier: Diagnosis of  By: Wynona Luna  Last Assessment & Plan:  No change. Continue use of Levitra as directed  . SCHATZKI'S RING 10/14/2009   Qualifier: Diagnosis of  By: Wynona Luna   . Schatzki's ring 10/14/2009   Overview:  Overview:  Qualifier: Diagnosis of  By: Leonette Nutting of this note might be different from the original. Overview:  Qualifier: Diagnosis of  By: Wynona Luna  . Screening for prostate cancer 08/28/2017  . Screening for STD (sexually transmitted disease) 06/06/2012   Formatting of this note might be different from the original. Last Assessment & Plan:  Patient requests STD screening. He experiences intermittent mild dysuria. See orders.  . Sleep apnea    CPAP  . 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 Strain of calf muscle, initial encounter 11/15/2016  . Viral warts 04/01/2009   Qualifier: Diagnosis of  By: Leonette Nutting of this note might be different from the original. Overview:  Qualifier: Diagnosis of  By: Wynona Luna  Last Assessment & Plan:  Discussed risk and benefits of procedure. Patient agreed to proceed. Area prepped with Betadine sticks. Utilized sterile technique to perform shave excision on 2 lesions. 0.8 mL of 2% lidocaine use for ane    Past Surgical History:  Procedure Laterality Date  . SMALL INTESTINE SURGERY     stretching of small intestine-- ring removal. Dr. Diona Fanti    Current Medications: Current Meds  Medication Sig  . fluticasone (FLONASE) 50 MCG/ACT nasal spray Place 1 spray into both nostrils daily.  . Multiple Vitamin (MULTIVITAMIN) tablet Take 1 tablet by mouth daily.  . sildenafil (VIAGRA) 100 MG tablet Take 0.5-1 tablets (50-100 mg total) by mouth daily as needed for erectile dysfunction.  . vardenafil (LEVITRA) 20  MG tablet TAKE 1 TABLET BY MOUTH EVERY DAY AS NEEDED FOR ERECTILE DYSFUNCTION     Allergies:   Patient has no known allergies.   Social History   Socioeconomic History  . Marital status: Single    Spouse name: Not on file  . Number of children: 2  . Years of education: Not on file  . Highest education level: Not on file  Occupational History  . Occupation: Futures trader: [employer]  Tobacco Use  . Smoking status: Never Smoker  . Smokeless tobacco: Never Used  Substance and Sexual Activity  . Alcohol use: Yes    Comment: socially  . Drug use: No  . Sexual activity: Not on file  Other Topics Concern  . Not on  file  Social History Narrative   Regular Exercise:  yes   Social Determinants of Health   Financial Resource Strain:   . Difficulty of Paying Living Expenses:   Food Insecurity:   . Worried About Charity fundraiser in the Last Year:   . Arboriculturist in the Last Year:   Transportation Needs:   . Film/video editor (Medical):   Marland Kitchen Lack of Transportation (Non-Medical):   Physical Activity:   . Days of Exercise per Week:   . Minutes of Exercise per Session:   Stress:   . Feeling of Stress :   Social Connections:   . Frequency of Communication with Friends and Family:   . Frequency of Social Gatherings with Friends and Family:   . Attends Religious Services:   . Active Member of Clubs or Organizations:   . Attends Archivist Meetings:   Marland Kitchen Marital Status:      Family History: The patient's family history includes Cancer in his maternal grandfather; Colon cancer in his maternal grandfather; Diabetes in his maternal grandmother; Heart disease in his maternal grandmother. There is no history of Esophageal cancer, Rectal cancer, or Stomach cancer.  ROS:   Please see the history of present illness.    All other systems reviewed and are negative.  EKGs/Labs/Other Studies Reviewed:    The following studies were reviewed today: I  discussed my findings with the patient at extensive length including lipids   Recent Labs: 11/12/2018: BUN 17; Creatinine, Ser 1.14; Hemoglobin 14.4; Platelets 208.0; Potassium 3.8; Sodium 139 11/24/2018: ALT 76  Recent Lipid Panel    Component Value Date/Time   CHOL 211 (H) 11/12/2018 1021   TRIG 296.0 (H) 11/12/2018 1021   HDL 37.30 (L) 11/12/2018 1021   CHOLHDL 6 11/12/2018 1021   VLDL 59.2 (H) 11/12/2018 1021   LDLCALC 134 (H) 04/18/2010 1224   LDLDIRECT 116.0 11/12/2018 1021    Physical Exam:    VS:  BP 110/80   Pulse 68   Ht 6\' 1"  (1.854 m)   Wt 277 lb (125.6 kg)   SpO2 97%   BMI 36.55 kg/m     Wt Readings from Last 3 Encounters:  06/03/19 277 lb (125.6 kg)  03/13/19 281 lb 6.4 oz (127.6 kg)  03/03/19 277 lb (125.6 kg)     GEN: Patient is in no acute distress HEENT: Normal NECK: No JVD; No carotid bruits LYMPHATICS: No lymphadenopathy CARDIAC: Hear sounds regular, 2/6 systolic murmur at the apex. RESPIRATORY:  Clear to auscultation without rales, wheezing or rhonchi  ABDOMEN: Soft, non-tender, non-distended MUSCULOSKELETAL:  No edema; No deformity  SKIN: Warm and dry NEUROLOGIC:  Alert and oriented x 3 PSYCHIATRIC:  Normal affect   Signed, Jenean Lindau, MD  06/03/2019 9:37 AM    [facility]

## 2019-08-03 MED ORDER — VARDENAFIL HCL 20 MG PO TABS: ORAL_TABLET | ORAL | 0 refills | Status: DC

## 2019-11-13 ENCOUNTER — Encounter: Payer: 59 | Admitting: Family Medicine

## 2019-11-20 ENCOUNTER — Ambulatory Visit (INDEPENDENT_AMBULATORY_CARE_PROVIDER_SITE_OTHER): Payer: 59 | Admitting: Family Medicine

## 2019-11-20 ENCOUNTER — Encounter: Payer: Self-pay | Admitting: Family Medicine

## 2019-11-20 ENCOUNTER — Other Ambulatory Visit (HOSPITAL_COMMUNITY)
Admission: RE | Admit: 2019-11-20 | Discharge: 2019-11-20 | Disposition: A | Discharge status: Home / Self Care | Payer: 59 | Source: Ambulatory Visit | Attending: Family Medicine | Admitting: Family Medicine

## 2019-11-20 ENCOUNTER — Other Ambulatory Visit: Payer: Self-pay

## 2019-11-20 VITALS — BP 118/72 | HR 67 | Temp 98.1°F | Ht 73.0 in | Wt 278.1 lb

## 2019-11-20 DIAGNOSIS — Z114 Encounter for screening for human immunodeficiency virus [HIV]: Secondary | ICD-10-CM

## 2019-11-20 DIAGNOSIS — Z113 Encounter for screening for infections with a predominantly sexual mode of transmission: Secondary | ICD-10-CM

## 2019-11-20 DIAGNOSIS — Z0000 Encounter for general adult medical examination without abnormal findings: Secondary | ICD-10-CM | POA: Diagnosis not present

## 2019-11-20 DIAGNOSIS — Z125 Encounter for screening for malignant neoplasm of prostate: Secondary | ICD-10-CM | POA: Diagnosis not present

## 2019-11-20 MED ORDER — VARDENAFIL HCL 20 MG PO TABS: ORAL_TABLET | ORAL | 5 refills | Status: DC

## 2019-11-20 NOTE — Patient Instructions (Signed)

## 2019-11-20 NOTE — Progress Notes (Signed)
Chief Complaint  Patient presents with  . Annual Exam    Well Male Adrian Schmidt is here for a complete physical.   His last physical was >1 year ago.  Current diet: in general, a "pretty healthy" diet.  Current exercise: going to start lifting Weight trend: stable Fatigue out of ordinary? No. Seat belt? Yes.    Health maintenance Shingrix- No Colonoscopy- Yes Tetanus- Yes HIV- Yes Hep C- Yes   Past Medical History:  Diagnosis Date  . Allergic rhinitis 03/23/2008   Qualifier: Diagnosis of  By: Leonette Nutting of this note might be different from the original. Overview:  Qualifier: Diagnosis of  By: Wynona Luna  . Allergy   . Atypical chest pain    negative stress and 2 D echo 2006  . Chest discomfort 11/15/2017  . Erectile dysfunction   . GERD (gastroesophageal reflux disease)   . Herpes genitalia   . Hyperlipidemia   . HYPOGONADISM 02/25/2008   Qualifier: Diagnosis of  By: Leonette Nutting of this note might be different from the original. Overview:  Qualifier: Diagnosis of  By: Wynona Luna  . Knee mass, left 06/05/2016  . Left knee pain 11/09/2015  . Low testosterone    normal iron, LH, FSH and estradiol  . Mixed dyslipidemia 11/15/2017  . Obesity 02/24/2008   Qualifier: Diagnosis of  By: Leonette Nutting of this note might be different from the original. Overview:  Qualifier: Diagnosis of  By: Wynona Luna  . OSA (obstructive sleep apnea) 11/14/2012   HST 11/2012:  AHI 34/hr.  ?pressure induced centrals by download (AHI still 6/hr on auto 5-20 without significant mask leak) 2016: on auto 5-10cm with significant breakthru.   Formatting of this note might be different from the original. Overview:  HST 11/2012:  AHI 34/hr.  ?pressure induced centrals by download (AHI still 6/hr on auto 5-20 without significant mask leak) 2016: on auto 5-10cm with   . Other dysphagia 10/07/2009   Qualifier: Diagnosis of  By: Leonette Nutting of this note might be different from the original. Overview:  Qualifier: Diagnosis of  By: Wynona Luna  . Other fatigue 11/01/2017  . Palpitations 03/03/2019  . Preventative health care 04/27/2011   Formatting of this note might be different from the original. Last Assessment & Plan:  Reviewed adult health maintenance protocols.  I encouraged weight loss.  Handout for low saturated fat diet provided.  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 Overview:  Qualifier: Diagnosis of  By: Wynona Luna  Last Assessment & Plan:  No change. Continue use of Levitra as directed  . SCHATZKI'S RING 10/14/2009   Qualifier: Diagnosis of  By: Wynona Luna   . Schatzki's ring 10/14/2009   Overview:  Overview:  Qualifier: Diagnosis of  By: Leonette Nutting of this note might be different from the original. Overview:  Qualifier: Diagnosis of  By: Wynona Luna  . Screening for prostate cancer 08/28/2017  . Screening for STD (sexually transmitted disease) 06/06/2012   Formatting of this note might be different from the original. Last Assessment & Plan:  Patient requests STD screening. He experiences intermittent mild dysuria. See orders.  . Sleep apnea    CPAP  .  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 Strain of calf muscle, initial encounter 11/15/2016  . Viral warts 04/01/2009   Qualifier: Diagnosis of  By: Leonette Nutting of this note might be different from the original. Overview:  Qualifier: Diagnosis of  By: Wynona Luna  Last Assessment & Plan:  Discussed risk and benefits of procedure. Patient agreed to proceed. Area prepped with Betadine sticks. Utilized sterile technique to perform shave excision on 2 lesions. 0.8 mL of 2% lidocaine use for ane      Past Surgical History:  Procedure Laterality Date  . SMALL INTESTINE SURGERY     stretching of small  intestine-- ring removal. Dr. Diona Fanti    Medications  Current Outpatient Medications on File Prior to Visit  Medication Sig Dispense Refill  . fluticasone (FLONASE) 50 MCG/ACT nasal spray Place 1 spray into both nostrils daily. 16 g 1  . Multiple Vitamin (MULTIVITAMIN) tablet Take 1 tablet by mouth daily.    . Omega-3 Fatty Acids (FISH OIL BURP-LESS) 1000 MG CAPS Take 2 g by mouth in the morning and at bedtime. 90 capsule 3  . vardenafil (LEVITRA) 20 MG tablet TAKE 1 TABLET BY MOUTH EVERY DAY AS NEEDED FOR ERECTILE DYSFUNCTION 10 tablet 0   No current facility-administered medications on file prior to visit.     Allergies No Known Allergies  Family History Family History  Problem Relation Age of Onset  . Cancer Maternal Grandfather        prostate and colon  . Colon cancer Maternal Grandfather   . Diabetes Maternal Grandmother   . Heart disease Maternal Grandmother        CAD  . Esophageal cancer Neg Hx   . Rectal cancer Neg Hx   . Stomach cancer Neg Hx     Review of Systems: Constitutional:  no fevers Eye:  no recent significant change in vision Ear/Nose/Mouth/Throat:  Ears:  no hearing loss Nose/Mouth/Throat:  no complaints of nasal congestion, no sore throat Cardiovascular:  no chest pain Respiratory:  no shortness of breath Gastrointestinal:  no change in bowel habits GU:  Male: negative for dysuria, frequency Musculoskeletal/Extremities:  No new joint pain Integumentary (Skin/Breast):  no abnormal skin lesions reported Neurologic:  no headaches Endocrine: No unexpected weight changes Hematologic/Lymphatic:  no abnormal bleeding  Exam BP 118/72 (BP Location: Left Arm, Patient Position: Sitting, Cuff Size: Large)   Pulse 67   Temp 98.1 F (36.7 C) (Oral)   Ht 6\' 1"  (1.854 m)   Wt 278 lb 2 oz (126.2 kg)   SpO2 98%   BMI 36.69 kg/m  General:  well developed, well nourished, in no apparent distress Skin:  no significant moles, warts, or growths Head:  no  masses, lesions, or tenderness Eyes:  pupils equal and round, sclera anicteric without injection Ears:  canals without lesions, TMs shiny without retraction, no obvious effusion, no erythema Nose:  nares patent, septum midline, mucosa normal Throat/Pharynx:  lips and gingiva without lesion; tongue and uvula midline; non-inflamed pharynx; no exudates or postnasal drainage Neck: neck supple without adenopathy, thyromegaly, or masses Cardiac: RRR, no bruits, no LE edema Lungs:  clear to auscultation, breath sounds equal bilaterally, no respiratory distress Rectal: Deferred Musculoskeletal:  symmetrical muscle groups noted without atrophy or deformity Neuro:  gait normal; deep tendon reflexes normal and symmetric Psych: well oriented with normal range of affect and appropriate judgment/insight  Assessment and Plan  Well adult exam - Plan: Lipid panel, Comprehensive metabolic  panel  Screening for HIV (human immunodeficiency virus) - Plan: HIV Antibody (routine testing w rflx)  Routine screening for STI (sexually transmitted infection) - Plan: Urine cytology ancillary only([place])  Screening PSA (prostate specific antigen) - Plan: PSA   Well 54 y.o. male. Counseled on diet and exercise. Counseled on risks and benefits of prostate cancer screening with PSA. The patient agrees to undergo testing. Immunizations, labs, and further orders as above. Follow up in 1 yr. The patient voiced understanding and agreement to the plan.  [place], DO 11/20/19 2:44 PM

## 2019-11-21 ENCOUNTER — Other Ambulatory Visit: Payer: Self-pay | Admitting: Family Medicine

## 2019-11-21 DIAGNOSIS — R7401 Elevation of levels of liver transaminase levels: Secondary | ICD-10-CM

## 2019-11-23 ENCOUNTER — Other Ambulatory Visit: Payer: Self-pay | Admitting: Family Medicine

## 2019-11-23 DIAGNOSIS — E782 Mixed hyperlipidemia: Secondary | ICD-10-CM

## 2019-11-23 LAB — COMPREHENSIVE METABOLIC PANEL
AG Ratio: 1.6 (calc) (ref 1.0–2.5)
ALT: 51 U/L — ABNORMAL HIGH (ref 9–46)
AST: 33 U/L (ref 10–35)
Albumin: 4.7 g/dL (ref 3.6–5.1)
Alkaline phosphatase (APISO): 63 U/L (ref 35–144)
BUN: 14 mg/dL (ref 7–25)
CO2: 28 mmol/L (ref 20–32)
Calcium: 9.8 mg/dL (ref 8.6–10.3)
Chloride: 105 mmol/L (ref 98–110)
Creat: 1.14 mg/dL (ref 0.70–1.33)
Globulin: 2.9 g/dL (calc) (ref 1.9–3.7)
Glucose, Bld: 89 mg/dL (ref 65–99)
Potassium: 4.4 mmol/L (ref 3.5–5.3)
Sodium: 140 mmol/L (ref 135–146)
Total Bilirubin: 0.6 mg/dL (ref 0.2–1.2)
Total Protein: 7.6 g/dL (ref 6.1–8.1)

## 2019-11-23 LAB — URINE CYTOLOGY ANCILLARY ONLY
Chlamydia: NEGATIVE
Comment: NEGATIVE
Comment: NEGATIVE
Comment: NORMAL
Neisseria Gonorrhea: NEGATIVE
Trichomonas: NEGATIVE

## 2019-11-23 LAB — PSA: PSA: 0.56 ng/mL (ref <=4.0)

## 2019-11-23 LAB — LIPID PANEL
Cholesterol: 238 mg/dL — ABNORMAL HIGH (ref <200)
HDL: 35 mg/dL — ABNORMAL LOW (ref >=40)
LDL Cholesterol (Calc): 167 mg/dL (calc) — ABNORMAL HIGH
Non-HDL Cholesterol (Calc): 203 mg/dL (calc) — ABNORMAL HIGH (ref <130)
Total CHOL/HDL Ratio: 6.8 (calc) — ABNORMAL HIGH (ref <5.0)
Triglycerides: 196 mg/dL — ABNORMAL HIGH (ref <150)

## 2019-11-23 LAB — HIV ANTIBODY (ROUTINE TESTING W REFLEX): HIV 1&2 Ab, 4th Generation: NONREACTIVE

## 2019-12-04 DIAGNOSIS — R0789 Other chest pain: Secondary | ICD-10-CM | POA: Insufficient documentation

## 2019-12-04 DIAGNOSIS — A6000 Herpesviral infection of urogenital system, unspecified: Secondary | ICD-10-CM | POA: Insufficient documentation

## 2019-12-04 DIAGNOSIS — T7840XA Allergy, unspecified, initial encounter: Secondary | ICD-10-CM | POA: Insufficient documentation

## 2019-12-04 DIAGNOSIS — G4730 Sleep apnea, unspecified: Secondary | ICD-10-CM | POA: Insufficient documentation

## 2019-12-07 ENCOUNTER — Ambulatory Visit: Payer: 59 | Admitting: Cardiology

## 2019-12-09 ENCOUNTER — Ambulatory Visit (HOSPITAL_BASED_OUTPATIENT_CLINIC_OR_DEPARTMENT_OTHER): Payer: 59

## 2019-12-15 ENCOUNTER — Encounter: Payer: Self-pay | Admitting: Family Medicine

## 2019-12-15 ENCOUNTER — Ambulatory Visit (HOSPITAL_BASED_OUTPATIENT_CLINIC_OR_DEPARTMENT_OTHER)
Admission: RE | Admit: 2019-12-15 | Discharge: 2019-12-15 | Disposition: A | Discharge status: Home / Self Care | Payer: 59 | Source: Ambulatory Visit | Attending: Family Medicine | Admitting: Family Medicine

## 2019-12-15 ENCOUNTER — Other Ambulatory Visit: Payer: Self-pay

## 2019-12-15 DIAGNOSIS — R7401 Elevation of levels of liver transaminase levels: Secondary | ICD-10-CM

## 2019-12-15 DIAGNOSIS — K760 Fatty (change of) liver, not elsewhere classified: Secondary | ICD-10-CM | POA: Insufficient documentation

## 2020-02-26 ENCOUNTER — Other Ambulatory Visit: Payer: Self-pay

## 2020-02-26 ENCOUNTER — Other Ambulatory Visit (INDEPENDENT_AMBULATORY_CARE_PROVIDER_SITE_OTHER): Payer: 59

## 2020-02-26 DIAGNOSIS — E782 Mixed hyperlipidemia: Secondary | ICD-10-CM | POA: Diagnosis not present

## 2020-02-26 LAB — LDL CHOLESTEROL, DIRECT: Direct LDL: 138 mg/dL

## 2020-02-26 LAB — LIPID PANEL
Cholesterol: 226 mg/dL — ABNORMAL HIGH (ref 0–200)
HDL: 36.4 mg/dL — ABNORMAL LOW (ref >39.00)
NonHDL: 189.36
Total CHOL/HDL Ratio: 6
Triglycerides: 301 mg/dL — ABNORMAL HIGH (ref 0.0–149.0)
VLDL: 60.2 mg/dL — ABNORMAL HIGH (ref 0.0–40.0)

## 2020-03-04 ENCOUNTER — Other Ambulatory Visit: Payer: Self-pay

## 2020-03-04 ENCOUNTER — Ambulatory Visit (INDEPENDENT_AMBULATORY_CARE_PROVIDER_SITE_OTHER): Payer: 59 | Admitting: Family Medicine

## 2020-03-04 ENCOUNTER — Encounter: Payer: Self-pay | Admitting: Family Medicine

## 2020-03-04 VITALS — BP 110/74 | HR 102 | Temp 98.3°F | Resp 18 | Ht 73.0 in | Wt 279.0 lb

## 2020-03-04 DIAGNOSIS — M7051 Other bursitis of knee, right knee: Secondary | ICD-10-CM | POA: Diagnosis not present

## 2020-03-04 DIAGNOSIS — M791 Myalgia, unspecified site: Secondary | ICD-10-CM

## 2020-03-04 MED ORDER — PREDNISONE 20 MG PO TABS: 40.0000 mg | ORAL_TABLET | Freq: Every day | ORAL | 0 refills | Status: AC

## 2020-03-04 NOTE — Patient Instructions (Addendum)
Ice/cold pack over area for 10-15 min twice daily.  OK to take Tylenol 1000 mg (2 extra strength tabs) or 975 mg (3 regular strength tabs) every 6 hours as needed.  Give [place] 2-3 business days to get the results of your labs back.   Let us know if you need anything.  Patellar Tendinitis Rehab Ask your health care provider which exercises are safe for you. Do exercises exactly as told by your health care provider and adjust them as directed. It is normal to feel mild stretching, pulling, tightness, or discomfort as you do these exercises, but you should stop right away if you feel sudden pain or your pain gets worse. Do not begin these exercises until told by your health care provider. Stretching and range of motion exercises This exercise warms up your muscles and joints and improves the movement and flexibility of your knee. This exercise also helps to relieve pain and stiffness. Exercise A: Hamstring, doorway  1. Lie on your back in front of a doorway with your R leg resting against the wall and your other leg flat on the floor in the doorway. There should be a slight bend in your knee. 2. Straighten your knee. You should feel a stretch behind your knee or thigh. If you do not, scoot your buttocks closer to the door. 3. Hold this position for 30 seconds. Repeat2 times. Complete this stretch 3 times per week. Strengthening exercises These exercises build strength and endurance in your knee. Endurance is the ability to use your muscles for a long time, even after they get tired. Exercise B: Quadriceps, isometric  1. Lie on your back with your R leg extended and your other knee bent. 2. Slowly tense the muscles in the front of your  thigh. When you do this, you should see your kneecap slide up toward your hip or see increased dimpling just above the knee. This motion will push the back of your knee toward the floor. If this is painful, try putting a rolled-up hand towel under your knee to support  it in a bent position. Change the size of the towel to find a position that allows you to do this exercise without any pain. 3. For 3 seconds, hold the muscle as tight as you can without increasing your pain. 4. Relax the muscles slowly and completely. Repeat 2 times. Complete this exercise 3 times per week. Exercise C: Straight leg raises ( quadriceps) 1. Lie on your back with your R leg extended and your other knee bent. 2. Tense the muscles in the front of your R thigh. When you do this, you should see your kneecap slide up or see increased dimpling just above the knee. 3. Keep these muscles tight as you raise your leg 4-6 inches (10-15 cm) off the floor. Do not let your moving knee bend. 4. Hold this position for 1 second. 5. Keep these muscles tense as you slowly lower your leg. 6. Relax your muscles slowly and completely. Repeat 2 times. Complete this exercise 3 times per week Exercise D: Squats 1. Stand in front of a table, with your feet and knees pointing straight ahead. You may rest your hands on the table for balance but not for support. 2. Slowly bend your knees and lower your hips like you are going to sit in a chair. ? Keep your weight over your heels, not over your toes. ? Keep your lower legs upright so they are parallel with the table legs. ?  Do not let your hips go lower than your knees. ? Do not bend lower than told by your health care provider. ? If your knee pain increases, do not bend as low. 3. Hold the squat position for 1 seconds. 4. Slowly push with your legs to return to standing. Do not use your hands to pull yourself to standing. Repeat 2 times. Complete this exercise 3 times per week. Exercise E: Step-downs 1. Stand on the edge of a step. 2. Keeping your weight over your R heel, slowly bend your R knee to bring your R heel toward the floor. Lower your heel as far as you can while keeping control and without increasing any discomfort. ? Do not let your knee  come forward. ? Use your leg muscles, not gravity, to lower your body. ? Hold a wall or rail for balance if needed. 3. Slowly push through your heel to lift your body weight back up. 4. Return to the starting position. Repeat2 times. Complete this exercise 3 times per week. Exercise F: Straight leg raises ( hip abductors) 1. Lie on your side with your R leg in the top position. Lie so your head, shoulder, knee, and hip line up. You may bend your lower knee to help you keep your balance. 2. Roll your hips slightly forward, so that your hips are stacked directly over each other and your R knee is facing forward. 3. Leading with your heel, lift your top leg 4-6 inches (10-15 cm). You should feel the muscles in your outer hip lifting. ? Do not let your foot drift forward. ? Do not let your knee roll toward the ceiling. 4. Hold this position for 3 seconds. 5. Slowly lower your leg to the starting position. 6. Let your muscles relax completely after each repetition. Repeat 2 times. Complete this exercise 3 times per week. This information is not intended to replace advice given to you by your health care provider. Make sure you discuss any questions you have with your health care provider. Document Released: 12/18/2004 Document Revised: 08/25/2015 Document Reviewed: 09/21/2014 Elsevier Interactive Patient Education  [employer].

## 2020-03-04 NOTE — Progress Notes (Signed)
Musculoskeletal Exam  Patient: Adrian Schmidt DOB: February 23, 1965  DOS: 03/04/2020  SUBJECTIVE:  Chief Complaint:   Chief Complaint  Patient presents with  . Knee Pain    No known injury, bilateral knee, swelling worse in right leg     Nicole L Diebel is a 55 y.o.  male for evaluation and treatment of R knee pain.   Onset:  1 year ago. No inj or change in activity.  Location:  Character:  aching and sharp  Progression of issue:  has worsened over past 6 mo Associated symptoms: swelling, wakes him up at night; no redness or bruising, decreased ROM Treatment: to date has been [employer].   Neurovascular symptoms: no  Body aches 1 yr of generalized aches. No inj or change in activity. He tried changing his vitamins recently where he hopes that will help. He tries to exercise routinely. No unexplained wt changes, rashes, bruising, redness.   Past Medical History:  Diagnosis Date  . Allergy   . GERD (gastroesophageal reflux disease)   . Herpes genitalia   . Knee mass, left 06/05/2016  . Left knee pain 11/09/2015  . Low testosterone    normal iron, LH, FSH and estradiol  . Mixed dyslipidemia 11/15/2017  . NAFLD (nonalcoholic fatty liver disease)   . Obesity 02/24/2008   Qualifier: Diagnosis of  By: Leonette Nutting of this note might be different from the original. Overview:  Qualifier: Diagnosis of  By: Wynona Luna  . OSA (obstructive sleep apnea) 11/14/2012   HST 11/2012:  AHI 34/hr.  ?pressure induced centrals by download (AHI still 6/hr on auto 5-20 without significant mask leak) 2016: on auto 5-10cm with significant breakthru.   Formatting of this note might be different from the original. Overview:  HST 11/2012:  AHI 34/hr.  ?pressure induced centrals by download (AHI still 6/hr on auto 5-20 without significant mask leak) 2016: on auto 5-10cm with   . Other dysphagia 10/07/2009   Qualifier: Diagnosis of  By: Leonette Nutting of this note might be  different from the original. Overview:  Qualifier: Diagnosis of  By: Wynona Luna  . Other fatigue 11/01/2017  . Palpitations 03/03/2019  . 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 Overview:  Qualifier: Diagnosis of  By: Wynona Luna  Last Assessment & Plan:  No change. Continue use of Levitra as directed  . SCHATZKI'S RING 10/14/2009   Qualifier: Diagnosis of  By: Wynona Luna   . Schatzki's ring 10/14/2009   Overview:  Overview:  Qualifier: Diagnosis of  By: Leonette Nutting of this note might be different from the original. Overview:  Qualifier: Diagnosis of  By: Wynona Luna  . 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 Viral warts 04/01/2009   Qualifier: Diagnosis of  By: Leonette Nutting of this note might be different from the original. Overview:  Qualifier: Diagnosis of  By: Wynona Luna  Last Assessment & Plan:  Discussed risk and benefits of procedure. Patient agreed to proceed. Area prepped with Betadine sticks. Utilized sterile technique to perform shave excision on 2 lesions. 0.8 mL of 2% lidocaine use for ane    Objective: VITAL SIGNS: BP 110/74 (BP Location: Left Arm, Patient Position: Sitting, Cuff Size: Large)   Pulse (!) 102   Temp  98.3 F (36.8 C)   Resp 18   Ht 6\' 1"  (1.854 m)   Wt 279 lb (126.6 kg)   SpO2 97%   BMI 36.81 kg/m  Constitutional: Well formed, well developed. No acute distress. Thorax & Lungs: No accessory muscle use Musculoskeletal: Right knee.   Normal active range of motion: yes.   Normal passive range of motion: yes Tenderness to palpation: Yes over the tibial tubercle Deformity: Yes, there is gross enlargement over the tibial tubercle Ecchymosis: no There is no tenderness to palpation over his bilateral muscle groups in upper extremities, lower extremity Neurologic: Normal sensory function. No focal deficits noted.   Psychiatric: Normal mood. Age appropriate judgment and insight. Alert & oriented x 3.    Assessment:  Infrapatellar bursitis of right knee  Myalgia - Plan: VITAMIN D 25 Hydroxy (Vit-D Deficiency, Fractures), Comprehensive metabolic panel, CBC  Plan: 1.  5-day prednisone burst, 40 mg daily.  Could inject.  Stretches/exercises, heat, ice, Tylenol.  2.  Check labs.  Stay hydrated. F/u pending above. The patient voiced understanding and agreement to the plan.   [place], DO 03/04/20  5:04 PM

## 2020-03-05 LAB — CBC
HCT: 44.1 % (ref 38.5–50.0)
Hemoglobin: 15.7 g/dL (ref 13.2–17.1)
MCH: 30.9 pg (ref 27.0–33.0)
MCHC: 35.6 g/dL (ref 32.0–36.0)
MCV: 86.8 fL (ref 80.0–100.0)
MPV: 10.2 fL (ref 7.5–12.5)
Platelets: 238 10*3/uL (ref 140–400)
RBC: 5.08 10*6/uL (ref 4.20–5.80)
RDW: 13.5 % (ref 11.0–15.0)
WBC: 7.1 10*3/uL (ref 3.8–10.8)

## 2020-03-05 LAB — COMPREHENSIVE METABOLIC PANEL
AG Ratio: 1.5 (calc) (ref 1.0–2.5)
ALT: 56 U/L — ABNORMAL HIGH (ref 9–46)
AST: 33 U/L (ref 10–35)
Albumin: 4.6 g/dL (ref 3.6–5.1)
Alkaline phosphatase (APISO): 65 U/L (ref 35–144)
BUN/Creatinine Ratio: 10 (calc) (ref 6–22)
BUN: 15 mg/dL (ref 7–25)
CO2: 26 mmol/L (ref 20–32)
Calcium: 10.4 mg/dL — ABNORMAL HIGH (ref 8.6–10.3)
Chloride: 103 mmol/L (ref 98–110)
Creat: 1.46 mg/dL — ABNORMAL HIGH (ref 0.70–1.33)
Globulin: 3 g/dL (calc) (ref 1.9–3.7)
Glucose, Bld: 88 mg/dL (ref 65–99)
Potassium: 3.9 mmol/L (ref 3.5–5.3)
Sodium: 140 mmol/L (ref 135–146)
Total Bilirubin: 0.4 mg/dL (ref 0.2–1.2)
Total Protein: 7.6 g/dL (ref 6.1–8.1)

## 2020-03-05 LAB — VITAMIN D 25 HYDROXY (VIT D DEFICIENCY, FRACTURES): Vit D, 25-Hydroxy: 24 ng/mL — ABNORMAL LOW (ref 30–100)

## 2020-03-07 ENCOUNTER — Other Ambulatory Visit: Payer: Self-pay

## 2020-03-07 DIAGNOSIS — E860 Dehydration: Secondary | ICD-10-CM

## 2020-03-10 ENCOUNTER — Other Ambulatory Visit: Payer: Self-pay

## 2020-03-10 ENCOUNTER — Other Ambulatory Visit: Payer: 59

## 2020-03-10 ENCOUNTER — Telehealth: Payer: Self-pay | Admitting: *Deleted

## 2020-03-10 DIAGNOSIS — E860 Dehydration: Secondary | ICD-10-CM

## 2020-03-10 NOTE — Telephone Encounter (Signed)
CMP reordered.

## 2020-03-10 NOTE — Telephone Encounter (Signed)
Pt has lab appt tomorrow.  Schedule note says CMP.  There are no CMP orders in future status. The only CMP I see was ordered as routine on 03/07/20.  Please reorder test as future with tomorrow as the expected date.  Thank you!

## 2020-03-11 ENCOUNTER — Other Ambulatory Visit: Payer: Self-pay

## 2020-03-11 ENCOUNTER — Other Ambulatory Visit (INDEPENDENT_AMBULATORY_CARE_PROVIDER_SITE_OTHER): Payer: 59

## 2020-03-11 DIAGNOSIS — E860 Dehydration: Secondary | ICD-10-CM

## 2020-03-11 NOTE — Addendum Note (Signed)
Addended by: Kem Boroughs D on: 03/11/2020 02:47 PM   Modules accepted: Orders

## 2020-03-12 LAB — COMPREHENSIVE METABOLIC PANEL
AG Ratio: 1.6 (calc) (ref 1.0–2.5)
ALT: 42 U/L (ref 9–46)
AST: 24 U/L (ref 10–35)
Albumin: 4.4 g/dL (ref 3.6–5.1)
Alkaline phosphatase (APISO): 61 U/L (ref 35–144)
BUN: 19 mg/dL (ref 7–25)
CO2: 30 mmol/L (ref 20–32)
Calcium: 9.3 mg/dL (ref 8.6–10.3)
Chloride: 101 mmol/L (ref 98–110)
Creat: 1.19 mg/dL (ref 0.70–1.33)
Globulin: 2.8 g/dL (calc) (ref 1.9–3.7)
Glucose, Bld: 90 mg/dL (ref 65–99)
Potassium: 4.4 mmol/L (ref 3.5–5.3)
Sodium: 139 mmol/L (ref 135–146)
Total Bilirubin: 0.4 mg/dL (ref 0.2–1.2)
Total Protein: 7.2 g/dL (ref 6.1–8.1)

## 2020-03-14 ENCOUNTER — Ambulatory Visit (INDEPENDENT_AMBULATORY_CARE_PROVIDER_SITE_OTHER): Payer: 59 | Admitting: Family Medicine

## 2020-03-14 ENCOUNTER — Encounter: Payer: Self-pay | Admitting: Family Medicine

## 2020-03-14 ENCOUNTER — Other Ambulatory Visit: Payer: Self-pay

## 2020-03-14 VITALS — BP 118/82 | HR 97 | Temp 98.1°F | Resp 18 | Ht 73.0 in | Wt 280.0 lb

## 2020-03-14 DIAGNOSIS — M7051 Other bursitis of knee, right knee: Secondary | ICD-10-CM | POA: Diagnosis not present

## 2020-03-14 MED ORDER — TRAMADOL HCL 50 MG PO TABS: 50.0000 mg | ORAL_TABLET | Freq: Three times a day (TID) | ORAL | 0 refills | Status: AC | PRN

## 2020-03-14 MED ORDER — METHYLPREDNISOLONE ACETATE 40 MG/ML IJ SUSP
40.0000 mg | Freq: Once | INTRAMUSCULAR | Status: AC
  Administered 2020-03-14: 40 mg via INTRA_ARTICULAR

## 2020-03-14 MED ORDER — MELOXICAM 15 MG PO TABS: 15.0000 mg | ORAL_TABLET | Freq: Every day | ORAL | 0 refills | Status: DC

## 2020-03-14 NOTE — Patient Instructions (Signed)
Ice/cold pack over area for 10-15 min twice daily.  OK to take Tylenol 1000 mg (2 extra strength tabs) or 975 mg (3 regular strength tabs) every 6 hours as needed.  Do not drink alcohol, do any illicit/street drugs, drive or do anything that requires alertness while on this medicine.   Send me a message in 2-3 weeks if we aren't turning the corner.   Let us know if you need anything.

## 2020-03-14 NOTE — Telephone Encounter (Signed)
Thanks

## 2020-03-14 NOTE — Progress Notes (Signed)
Chief Complaint  Patient presents with  . Follow-up    Knee pain ,  unchanged     Subjective: Patient is a 55 y.o. male here for f/u knee pain.  Seen for infrapatellar bursitis on 3/4 and given 5 d pred burst. He reports compliance but did not have any improvement. No new s/s's. No new inj or change in activity. Difficult to work with this.     Past Medical History:  Diagnosis Date  . Allergy   . GERD (gastroesophageal reflux disease)   . Herpes genitalia   . Knee mass, left 06/05/2016  . Left knee pain 11/09/2015  . Low testosterone    normal iron, LH, FSH and estradiol  . Mixed dyslipidemia 11/15/2017  . NAFLD (nonalcoholic fatty liver disease)   . Obesity 02/24/2008   Qualifier: Diagnosis of  By: Leonette Nutting of this note might be different from the original. Overview:  Qualifier: Diagnosis of  By: Wynona Luna  . OSA (obstructive sleep apnea) 11/14/2012   HST 11/2012:  AHI 34/hr.  ?pressure induced centrals by download (AHI still 6/hr on auto 5-20 without significant mask leak) 2016: on auto 5-10cm with significant breakthru.   Formatting of this note might be different from the original. Overview:  HST 11/2012:  AHI 34/hr.  ?pressure induced centrals by download (AHI still 6/hr on auto 5-20 without significant mask leak) 2016: on auto 5-10cm with   . Other dysphagia 10/07/2009   Qualifier: Diagnosis of  By: Leonette Nutting of this note might be different from the original. Overview:  Qualifier: Diagnosis of  By: Wynona Luna  . Other fatigue 11/01/2017  . Palpitations 03/03/2019  . 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 Overview:  Qualifier: Diagnosis of  By: Wynona Luna  Last Assessment & Plan:  No change. Continue use of Levitra as directed  . SCHATZKI'S RING 10/14/2009   Qualifier: Diagnosis of  By: Wynona Luna   . Schatzki's ring  10/14/2009   Overview:  Overview:  Qualifier: Diagnosis of  By: Leonette Nutting of this note might be different from the original. Overview:  Qualifier: Diagnosis of  By: Wynona Luna  . 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 Viral warts 04/01/2009   Qualifier: Diagnosis of  By: Leonette Nutting of this note might be different from the original. Overview:  Qualifier: Diagnosis of  By: Wynona Luna  Last Assessment & Plan:  Discussed risk and benefits of procedure. Patient agreed to proceed. Area prepped with Betadine sticks. Utilized sterile technique to perform shave excision on 2 lesions. 0.8 mL of 2% lidocaine use for ane    Objective: BP 118/82   Pulse 97   Temp 98.1 F (36.7 C)   Resp 18   Ht 6\' 1"  (1.854 m)   Wt 280 lb (127 kg)   SpO2 97%   BMI 36.94 kg/m  General: Awake, appears stated age MSK: +gross swelling over tibial tubercle that is ttp w minimal warmth Lungs: No accessory muscle use Psych: Age appropriate judgment and insight, normal affect and mood  Procedure note: R infrapatellar bursa injection Verbal consent obtained. The area of interest was noted inferior to the swollen bursa. It was cleaned with an alcohol swab. Freeze spray was used. A 25 g needle  was inserted at a parallel angle to the area of interested. The plunger was withdrawn to ensure our placement was not in a vessel. 1 mL of 1% lidocaine without epi and 40 mg of Depo was injected. A bandaid was placed. The patient tolerated the procedure well.  There were no complications noted.   Assessment and Plan: Infrapatellar bursitis of right knee - Plan: meloxicam (MOBIC) 15 MG tablet, traMADol (ULTRAM) 50 MG tablet, PR DRAIN/INJECT LARGE JOINT/BURSA  Meloxicam prn. Tramadol prn. Warnings about tramadol given. Injection today. Refer sports medicine if no better. Cont Icing.  The patient voiced understanding and agreement to the plan.  [place], DO 03/14/20  9:26  AM

## 2020-03-14 NOTE — Addendum Note (Signed)
Addended by: Wynonia Musty A on: 03/14/2020 10:09 AM   Modules accepted: Orders

## 2020-03-14 NOTE — Telephone Encounter (Signed)
Patient was seen today in office .

## 2020-03-18 ENCOUNTER — Ambulatory Visit: Payer: 59 | Admitting: Cardiology

## 2020-03-21 ENCOUNTER — Encounter: Payer: Self-pay | Admitting: Family Medicine

## 2020-03-21 NOTE — Telephone Encounter (Signed)
Patient need doctors note from 03/02/20 thru march 19

## 2020-04-10 ENCOUNTER — Other Ambulatory Visit: Payer: Self-pay | Admitting: Family Medicine

## 2020-04-10 DIAGNOSIS — M7051 Other bursitis of knee, right knee: Secondary | ICD-10-CM

## 2020-04-28 ENCOUNTER — Other Ambulatory Visit: Payer: Self-pay

## 2020-05-06 ENCOUNTER — Ambulatory Visit: Payer: 59 | Admitting: Cardiology

## 2020-06-27 ENCOUNTER — Other Ambulatory Visit: Payer: Self-pay

## 2020-06-27 ENCOUNTER — Encounter: Payer: Self-pay | Admitting: Cardiology

## 2020-06-27 ENCOUNTER — Ambulatory Visit (INDEPENDENT_AMBULATORY_CARE_PROVIDER_SITE_OTHER): Payer: 59 | Admitting: Cardiology

## 2020-06-27 VITALS — BP 130/76 | HR 80 | Ht 73.0 in | Wt 289.0 lb

## 2020-06-27 DIAGNOSIS — R0789 Other chest pain: Secondary | ICD-10-CM | POA: Diagnosis not present

## 2020-06-27 DIAGNOSIS — R002 Palpitations: Secondary | ICD-10-CM

## 2020-06-27 DIAGNOSIS — E669 Obesity, unspecified: Secondary | ICD-10-CM

## 2020-06-27 DIAGNOSIS — G4730 Sleep apnea, unspecified: Secondary | ICD-10-CM

## 2020-06-27 DIAGNOSIS — E782 Mixed hyperlipidemia: Secondary | ICD-10-CM | POA: Diagnosis not present

## 2020-06-27 HISTORY — DX: Obesity, unspecified: E66.9

## 2020-06-27 NOTE — Patient Instructions (Signed)
Medication Instructions:  No medication changes. *If you need a refill on your cardiac medications before your next appointment, please call your pharmacy*   Lab Work: None ordered If you have labs (blood work) drawn today and your tests are completely normal, you will receive your results only by: [place] (if you have MyChart) OR A paper copy in the mail If you have any lab test that is abnormal or we need to change your treatment, we will call you to review the results.   Testing/Procedures: None ordered   Follow-Up: At [facility], you and your health needs are our priority.  As part of our continuing mission to provide you with exceptional heart care, we have created designated Provider Care Teams.  These Care Teams include your primary Cardiologist (physician) and Advanced Practice Providers (APPs -  Physician Assistants and Nurse Practitioners) who all work together to provide you with the care you need, when you need it.  We recommend signing up for the patient portal called "MyChart".  Sign up information is provided on this After Visit Summary.  MyChart is used to connect with patients for Virtual Visits (Telemedicine).  Patients are able to view lab/test results, encounter notes, upcoming appointments, etc.  Non-urgent messages can be sent to your provider as well.   To learn more about what you can do with MyChart, go to [email or web address].    Your next appointment:   12 month(s)  The format for your next appointment:   In Person  Provider:   Jyl Heinz, MD   Other Instructions NA

## 2020-06-27 NOTE — Progress Notes (Signed)
Cardiology Office Note:    Date:  06/27/2020   ID:  Adrian Schmidt, Fuhrmann 04-24-1965, MRN [id number]  PCP:  Shelda Pal, DO  Cardiologist:  Jenean Lindau, MD   Referring MD: Shelda Pal*    ASSESSMENT:    1. Mixed hyperlipidemia   2. Palpitations   3. Chest discomfort   4. Sleep apnea, unspecified type   5. Obesity (BMI 35.0-39.9 without comorbidity)    PLAN:    In order of problems listed above:  Primary prevention stressed with the patient.  Importance of compliance with diet medication stressed any vocalized understanding. Obesity: Weight reduction was stressed diet was emphasized risks of obesity explained and he vocalized understanding. Mixed dyslipidemia: Diet emphasized.  Importance of exercise stressed.  He was told to walk at least half an hour a day 5 days a week and he promises to do so. Patient will be seen in follow-up appointment in 12 months or earlier if the patient has any concerns    Medication Adjustments/Labs and Tests Ordered: Current medicines are reviewed at length with the patient today.  Concerns regarding medicines are outlined above.  Orders Placed This Encounter  Procedures   EKG 12-Lead   No orders of the defined types were placed in this encounter.    No chief complaint on file.    History of Present Illness:    Adrian Schmidt is a 55 y.o. male.  Patient is essentially a healthy gentleman.  He has history of hypertension.  He has mild dyslipidemia.  He denies any chest pain orthopnea or PND.  He was evaluated by this.  His calcium score is 0.  He leads a sedentary lifestyle and has been lax with diet.  At the time of my evaluation, the patient is alert awake oriented and in no distress.  He is here for a follow-up appointment.  Past Medical History:  Diagnosis Date   Allergic rhinitis 03/23/2008   Qualifier: Diagnosis of  By: Leonette Nutting of this note might be different from the original.  Overview:  Qualifier: Diagnosis of  By: Wynona Luna   Allergy    Atypical chest pain    negative stress and 2 D echo 2006   Chest discomfort 11/15/2017   Erectile dysfunction 11/01/2017   GERD (gastroesophageal reflux disease)    Herpes genitalia    Hyperlipidemia 02/24/2008   Qualifier: Diagnosis of  By: Leonette Nutting of this note might be different from the original. Overview:  Qualifier: Diagnosis of  By: Wynona Luna  Last Assessment & Plan:  Mild improvement with dietary/lifestyle change. Patient defers use of statin for now. Repeat fasting lipid profile and C-reactive protein in one year Educational handout on low saturated fat diet provided.   HYPOGONADISM 02/25/2008   Qualifier: Diagnosis of  By: Leonette Nutting of this note might be different from the original. Overview:  Qualifier: Diagnosis of  By: Wynona Luna   Knee mass, left 06/05/2016   Left knee pain 11/09/2015   Mixed dyslipidemia 11/15/2017   NAFLD (nonalcoholic fatty liver disease)    Obesity 02/24/2008   Qualifier: Diagnosis of  By: Leonette Nutting of this note might be different from the original. Overview:  Qualifier: Diagnosis of  By: Wynona Luna   OSA (obstructive sleep apnea) 11/14/2012   HST 11/2012:  AHI 34/hr.  ?  pressure induced centrals by download (AHI still 6/hr on auto 5-20 without significant mask leak) 2016: on auto 5-10cm with significant breakthru.   Formatting of this note might be different from the original. Overview:  HST 11/2012:  AHI 34/hr.  ?pressure induced centrals by download (AHI still 6/hr on auto 5-20 without significant mask leak) 2016: on auto 5-10cm with    Other dysphagia 10/07/2009   Qualifier: Diagnosis of  By: Leonette Nutting of this note might be different from the original. Overview:  Qualifier: Diagnosis of  By: Wynona Luna   Other fatigue 11/01/2017   Palpitations 03/03/2019   Preventative  health care 04/27/2011   Formatting of this note might be different from the original. Last Assessment & Plan:  Reviewed adult health maintenance protocols.  I encouraged weight loss.  Handout for low saturated fat diet provided.   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 Overview:  Qualifier: Diagnosis of  By: Wynona Luna  Last Assessment & Plan:  No change. Continue use of Levitra as directed   SCHATZKI'S RING 10/14/2009   Qualifier: Diagnosis of  By: Wynona Luna    Schatzki's ring 10/14/2009   Overview:  Overview:  Qualifier: Diagnosis of  By: Leonette Nutting of this note might be different from the original. Overview:  Qualifier: Diagnosis of  By: Wynona Luna   Screening for prostate cancer 08/28/2017   Screening for STD (sexually transmitted disease) 06/06/2012   Formatting of this note might be different from the original. Last Assessment & Plan:  Patient requests STD screening. He experiences intermittent mild dysuria. See orders.   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 Overview:  Qualifier: Diagnosis of  By: Wynona Luna  Last Assessment & Plan:  Discussed risk and benefits of procedure. Patient agreed to proceed. Area prepped with Betadine sticks. Utilized sterile technique to perform shave excision on 2 lesions. 0.8 mL of 2% lidocaine use for ane    Past Surgical History:  Procedure Laterality Date   SMALL INTESTINE SURGERY     stretching of small intestine-- ring removal. Dr. Diona Fanti    Current Medications: Current Meds  Medication Sig   fluticasone (FLONASE) 50 MCG/ACT nasal spray Place 1 spray into both nostrils daily.   Multiple Vitamin  (MULTIVITAMIN) tablet Take 1 tablet by mouth daily.   Omega-3 Fatty Acids (FISH OIL BURP-LESS) 1000 MG CAPS Take 2 g by mouth in the morning and at bedtime.   vardenafil (LEVITRA) 20 MG tablet Take 20 mg by mouth daily as needed for erectile dysfunction.     Allergies:   Patient has no known allergies.   Social History   Socioeconomic History   Marital status: Single    Spouse name: Not on file   Number of children: 2   Years of education: Not on file   Highest education level: Not on file  Occupational History   Occupation: EXPIDETOR    Employer: [employer]  Tobacco Use   Smoking status: Never   Smokeless tobacco: Never  Vaping Use   Vaping Use: Never used  Substance  and Sexual Activity   Alcohol use: Yes    Comment: socially   Drug use: No   Sexual activity: Not on file  Other Topics Concern   Not on file  Social History Narrative   Regular Exercise:  yes   Social Determinants of Health   Financial Resource Strain: Not on file  Food Insecurity: Not on file  Transportation Needs: Not on file  Physical Activity: Not on file  Stress: Not on file  Social Connections: Not on file     Family History: The patient's family history includes Cancer in his maternal grandfather; Colon cancer in his maternal grandfather; Diabetes in his maternal grandmother; Heart disease in his maternal grandmother. There is no history of Esophageal cancer, Rectal cancer, or Stomach cancer.  ROS:   Please see the history of present illness.    All other systems reviewed and are negative.  EKGs/Labs/Other Studies Reviewed:    The following studies were reviewed today: EKG reveals sinus rhythm and nonspecific ST-T changes.  Calcium score is 0.   Recent Labs: 03/04/2020: Hemoglobin 15.7; Platelets 238 03/11/2020: ALT 42; BUN 19; Creat 1.19; Potassium 4.4; Sodium 139  Recent Lipid Panel    Component Value Date/Time   CHOL 226 (H) 02/26/2020 1104   TRIG 301.0 (H) 02/26/2020 1104    HDL 36.40 (L) 02/26/2020 1104   CHOLHDL 6 02/26/2020 1104   VLDL 60.2 (H) 02/26/2020 1104   LDLCALC 167 (H) 11/20/2019 1452   LDLDIRECT 138.0 02/26/2020 1104    Physical Exam:    VS:  BP 130/76   Pulse 80   Ht 6\' 1"  (1.854 m)   Wt 289 lb 0.6 oz (131.1 kg)   SpO2 96%   BMI 38.13 kg/m     Wt Readings from Last 3 Encounters:  06/27/20 289 lb 0.6 oz (131.1 kg)  03/14/20 280 lb (127 kg)  03/04/20 279 lb (126.6 kg)     GEN: Patient is in no acute distress HEENT: Normal NECK: No JVD; No carotid bruits LYMPHATICS: No lymphadenopathy CARDIAC: Hear sounds regular, 2/6 systolic murmur at the apex. RESPIRATORY:  Clear to auscultation without rales, wheezing or rhonchi  ABDOMEN: Soft, non-tender, non-distended MUSCULOSKELETAL:  No edema; No deformity  SKIN: Warm and dry NEUROLOGIC:  Alert and oriented x 3 PSYCHIATRIC:  Normal affect   Signed, Jenean Lindau, MD  06/27/2020 3:55 PM    [facility]

## 2020-08-17 ENCOUNTER — Telehealth: Payer: Self-pay | Admitting: Family Medicine

## 2020-08-17 NOTE — Telephone Encounter (Signed)
Patient states that on 03-04-2020 he came in for an office visit/blood work but insurance is not covering it because there is no medical necessity order attached to it. Patient's insurance advised the patient to call us so those orders could be added and then insurance will cover it. Patient is aware it would take a few weeks. Please advice.

## 2020-08-17 NOTE — Telephone Encounter (Signed)
Sched appt. Ty.

## 2020-08-17 NOTE — Telephone Encounter (Signed)
Patient states he tested positive for covid 08/15 after feeling sick for a few days with headaches, low energy, and congestion. He states he is feeling better and taking over-the-counter medication but because he has been out of work he will he need a work note. Please advice.

## 2020-08-17 NOTE — Telephone Encounter (Signed)
Email sent to charge correction. They will contact pt.

## 2020-08-17 NOTE — Telephone Encounter (Signed)
Called the patient and informed of PCP instructions. PCP had no openings for Friday/scheduled him with Jodi Mourning for 08/18/20 Video Visit. The patient verbalized understanding of the appointment and he is in the state [place]/he is at home currently.

## 2020-08-18 ENCOUNTER — Other Ambulatory Visit: Payer: Self-pay

## 2020-08-18 ENCOUNTER — Other Ambulatory Visit: Payer: Self-pay | Admitting: Family

## 2020-08-18 ENCOUNTER — Telehealth (INDEPENDENT_AMBULATORY_CARE_PROVIDER_SITE_OTHER): Payer: 59 | Admitting: Family

## 2020-08-18 DIAGNOSIS — U071 COVID-19: Secondary | ICD-10-CM | POA: Diagnosis not present

## 2020-08-18 NOTE — Progress Notes (Signed)
Adrian Schmidt is a 55 y.o. 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   Palpitations 03/03/2019   Mixed dyslipidemia 11/15/2017   Chest discomfort 11/15/2017   Erectile dysfunction 11/01/2017   Other fatigue 11/01/2017   Screening for prostate cancer 08/28/2017   Strain of calf muscle, initial encounter 11/15/2016   Knee mass, left 06/05/2016   Left knee pain 11/09/2015   GERD (gastroesophageal reflux disease) 11/09/2015   OSA (obstructive sleep apnea) 11/14/2012   Screening for STD (sexually transmitted disease) 06/06/2012   Preventative health care 04/27/2011   [facility] RING 10/14/2009   Schatzki's ring 10/14/2009   Other dysphagia 10/07/2009   Viral warts 04/01/2009   Allergic rhinitis 03/23/2008   HYPOGONADISM 02/25/2008   Hyperlipidemia 02/24/2008   Obesity 02/24/2008   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     Omega-3 Fatty Acids (FISH OIL BURP-LESS) 1000 MG CAPS Take 2 g by mouth in the morning and at bedtime. 90 capsule 3   vardenafil (LEVITRA) 20 MG tablet Take 20 mg by mouth daily as needed for erectile dysfunction.     fluticasone (FLONASE) 50 MCG/ACT nasal spray Place 1 spray into both nostrils daily. (Patient not taking: Reported on 08/18/2020) 16 g 1   No current facility-administered medications for this visit.    Allergies: Patient has no known allergies.  Past Medical History:  Diagnosis Date   Allergic rhinitis 03/23/2008   Qualifier: Diagnosis of  By: Leonette Nutting of this note might be different from the  original. Overview:  Qualifier: Diagnosis of  By: Wynona Luna   Allergy    Atypical chest pain    negative stress and 2 D echo 2006   Chest discomfort 11/15/2017   Erectile dysfunction 11/01/2017   GERD (gastroesophageal reflux disease)    Herpes genitalia    Hyperlipidemia 02/24/2008   Qualifier: Diagnosis of  By: Leonette Nutting of this note might be different from the original. Overview:  Qualifier: Diagnosis of  By: Wynona Luna  Last Assessment & Plan:  Mild improvement with dietary/lifestyle change. Patient defers use of statin for now. Repeat fasting lipid profile and C-reactive protein in one year Educational handout on low saturated fat diet provided.   HYPOGONADISM 02/25/2008   Qualifier: Diagnosis of  By: Leonette Nutting of this note might be different from the original. Overview:  Qualifier: Diagnosis of  By: Wynona Luna   Knee mass, left 06/05/2016   Left knee pain 11/09/2015   Mixed dyslipidemia 11/15/2017   NAFLD (nonalcoholic fatty liver disease)    Obesity 02/24/2008   Qualifier: Diagnosis of  By: Leonette Nutting of this note might be different from the original. Overview:  Qualifier: Diagnosis of  By: Wynona Luna   OSA (obstructive sleep apnea) 11/14/2012   HST 11/2012:  AHI 34/hr.  ?pressure induced centrals by download (AHI still 6/hr on auto 5-20 without significant mask leak) 2016:  on auto 5-10cm with significant breakthru.   Formatting of this note might be different from the original. Overview:  HST 11/2012:  AHI 34/hr.  ?pressure induced centrals by download (AHI still 6/hr on auto 5-20 without significant mask leak) 2016: on auto 5-10cm with    Other dysphagia 10/07/2009   Qualifier: Diagnosis of  By: Leonette Nutting of this note might be different from the original. Overview:  Qualifier: Diagnosis of  By: Wynona Luna   Other fatigue 11/01/2017   Palpitations 03/03/2019    Preventative health care 04/27/2011   Formatting of this note might be different from the original. Last Assessment & Plan:  Reviewed adult health maintenance protocols.  I encouraged weight loss.  Handout for low saturated fat diet provided.   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 Overview:  Qualifier: Diagnosis of  By: Wynona Luna  Last Assessment & Plan:  No change. Continue use of Levitra as directed   SCHATZKI'S RING 10/14/2009   Qualifier: Diagnosis of  By: Wynona Luna    Schatzki's ring 10/14/2009   Overview:  Overview:  Qualifier: Diagnosis of  By: Leonette Nutting of this note might be different from the original. Overview:  Qualifier: Diagnosis of  By: Wynona Luna   Screening for prostate cancer 08/28/2017   Screening for STD (sexually transmitted disease) 06/06/2012   Formatting of this note might be different from the original. Last Assessment & Plan:  Patient requests STD screening. He experiences intermittent mild dysuria. See orders.   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 Overview:  Qualifier: Diagnosis of  By: Wynona Luna  Last Assessment & Plan:  Discussed risk and benefits of procedure. Patient agreed to proceed. Area prepped with Betadine sticks. Utilized sterile technique to perform shave excision on 2 lesions. 0.8 mL of 2% lidocaine use for ane    Past Surgical History:  Procedure Laterality Date   SMALL INTESTINE SURGERY     stretching of small intestine-- ring removal. Dr. Diona Fanti    Family History  Problem Relation Age of Onset   Cancer Maternal Grandfather        prostate and colon   Colon cancer Maternal Grandfather    Diabetes  Maternal Grandmother    Heart disease Maternal Grandmother        CAD   Esophageal cancer Neg Hx    Rectal cancer Neg Hx    Stomach cancer Neg Hx     Social History   Tobacco Use   Smoking status: Never   Smokeless tobacco: Never  Substance Use Topics   Alcohol use: Yes    Comment: socially    Subjective:    I connected with Adrian Schmidt on 08/18/20 at  3:40 PM EDT by a video enabled telemedicine application and verified that I am speaking with the correct person using two identifiers.   I discussed the limitations of evaluation and management by telemedicine and the availability of in person appointments. The patient expressed understanding and agreed to proceed. Provider in office/ patient is at home; provider and patient are only 2  people on video call.   Started to feel sick- "weak, light-headed" over the weekend and symptoms progressively worsened; tested positive for COVID on Monday; feels that worst of his symptoms are behind him and now just having some mild congestion; no fever, chest pain or shortness of breath;  Requesting note for work for this past week;    Objective:  There were no vitals filed for this visit.  General: Well developed, well nourished, in no acute distress  Skin : Warm and dry.  Head: Normocephalic and atraumatic  Lungs: Respirations unlabored;  Neurologic: Alert and oriented; speech intact; face symmetrical;   Assessment:  1. COVID-19     Plan:  Recovering well; no apparent secondary infections or concerning symptoms at this time; work note given as requested; increase fluids, rest and follow up worse, no better.   No follow-ups on file.  No orders of the defined types were placed in this encounter.   Requested Prescriptions    No prescriptions requested or ordered in this encounter

## 2020-09-09 ENCOUNTER — Other Ambulatory Visit: Payer: Self-pay

## 2020-09-09 ENCOUNTER — Encounter: Payer: Self-pay | Admitting: Pulmonary Disease

## 2020-09-09 ENCOUNTER — Ambulatory Visit (INDEPENDENT_AMBULATORY_CARE_PROVIDER_SITE_OTHER): Payer: 59 | Admitting: Pulmonary Disease

## 2020-09-09 VITALS — BP 124/78 | HR 80 | Temp 97.8°F | Ht 73.0 in | Wt 284.4 lb

## 2020-09-09 DIAGNOSIS — G4730 Sleep apnea, unspecified: Secondary | ICD-10-CM

## 2020-09-09 DIAGNOSIS — E669 Obesity, unspecified: Secondary | ICD-10-CM

## 2020-09-09 DIAGNOSIS — G4733 Obstructive sleep apnea (adult) (pediatric): Secondary | ICD-10-CM | POA: Diagnosis not present

## 2020-09-09 DIAGNOSIS — Z9989 Dependence on other enabling machines and devices: Secondary | ICD-10-CM | POA: Diagnosis not present

## 2020-09-09 DIAGNOSIS — G478 Other sleep disorders: Secondary | ICD-10-CM | POA: Diagnosis not present

## 2020-09-09 NOTE — Patient Instructions (Signed)
Will arrange for new Resmed auto CPAP machine  Follow up in 6 months

## 2020-09-09 NOTE — Progress Notes (Signed)
[facility]  Chief Complaint  Patient presents with   Follow-up    Doing well, using CPAP old machine, wanting new one. Having sleep paralysis more often    Constitutional:  BP 124/78 (BP Location: Right Arm, Cuff Size: Normal)   Pulse 80   Temp 97.8 F (36.6 C) (Oral)   Ht '6\' 1"'$  (1.854 m)   Wt 284 lb 6.4 oz (129 kg)   SpO2 99%   BMI 37.52 kg/m   Past Medical History:  ED, GERD, HLD, Fatty liver, Schatzki's ring  Past Surgical History:  He  has a past surgical history that includes Small intestine surgery.  Brief Summary:  Adrian Schmidt is a 55 y.o. male with obstructive sleep apnea and sleep paralysis.      Subjective:   He had sleep study in November 2014.  Showed severe obstructive sleep apnea.  Previously seen by Dr. Danton Sewer.  Most recently seen by Tammy Parrett.  Never changed to [facility] for his DME.  Had CPAP setting changed to 10 - 16 cm H2O in March 2021.  He gets episode of sleep paralysis when he doesn't use CPAP.  Using nasal pillows mask.  Not having sinus congestion, dry mouth, or aerophagia.  No history of sleep hallucinations, or cataplexy.  Physical Exam:   Appearance - well kempt   ENMT - no sinus tenderness, no oral exudate, no LAN, Mallampati 4 airway, no stridor  Respiratory - equal breath sounds bilaterally, no wheezing or rales  CV - s1s2 regular rate and rhythm, no murmurs  Ext - no clubbing, no edema  Skin - no rashes  Psych - normal mood and affect   Sleep Tests:  HST 11/19/12 >> AHI 33.7, SpO2 low 76%  Social History:  He  reports that he has never smoked. He has never used smokeless tobacco. He reports current alcohol use. He reports that he does not use drugs.  Family History:  His family history includes Cancer in his maternal grandfather; Colon cancer in his maternal grandfather; Diabetes in his maternal grandmother; Heart disease in his maternal grandmother.      Assessment/Plan:   Obstructive sleep apnea. 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 - he is aware of the association with weight and sleep apnea  Time Spent Involved in Patient Care on Day of Examination:  31 minutes  Follow up:   Patient Instructions  Will arrange for new Resmed auto CPAP machine  Follow up in 6 months  Medication List:   Allergies as of 09/09/2020   No Known Allergies      Medication List        Accurate as of September 09, 2020 10:13 AM. If you have any questions, ask your nurse or doctor.          Fish Oil Burp-Less 1000 MG Caps Take 2 g by mouth in the morning and at bedtime.  fluticasone 50 MCG/ACT nasal spray Commonly known as: FLONASE Place 1 spray into both nostrils daily.   multivitamin tablet Take 1 tablet by mouth daily.   vardenafil 20 MG tablet Commonly known as: LEVITRA Take 20 mg by mouth daily as needed for erectile dysfunction.        Signature:  Chesley Mires, MD [place] Pager - [phone] 09/09/2020, 10:13 AM

## 2020-09-20 ENCOUNTER — Other Ambulatory Visit: Payer: Self-pay | Admitting: Family Medicine

## 2020-09-20 ENCOUNTER — Telehealth: Payer: Self-pay | Admitting: Family Medicine

## 2020-09-20 NOTE — Telephone Encounter (Signed)
Pt. Called in and stated that no one has contacted him regarding this matter and now [facility] is sending him a bill for those labs drawn. He stated his insurance needs more detail on reasons for labs so they will cover.

## 2020-09-27 NOTE — Telephone Encounter (Signed)
Charge correction was emailed a second time to contact pt.

## 2020-10-20 ENCOUNTER — Other Ambulatory Visit: Payer: Self-pay | Admitting: Family Medicine

## 2020-11-04 NOTE — Telephone Encounter (Signed)
Pt. Called and stated no one has contacted him regarding this matter. He just got another bill from his insurance regarding this matter and he does not want it to go on his credit when he is doing his part to get this matter resolved. He stated is their a contact number for him to call them. He wants to get this situation handled since he has been reaching out for months now.

## 2020-11-15 NOTE — Telephone Encounter (Signed)
Error

## 2020-11-17 ENCOUNTER — Other Ambulatory Visit: Payer: Self-pay

## 2020-11-18 ENCOUNTER — Ambulatory Visit (INDEPENDENT_AMBULATORY_CARE_PROVIDER_SITE_OTHER): Payer: 59 | Admitting: Family Medicine

## 2020-11-18 ENCOUNTER — Encounter: Payer: Self-pay | Admitting: Family Medicine

## 2020-11-18 ENCOUNTER — Other Ambulatory Visit: Payer: Self-pay

## 2020-11-18 ENCOUNTER — Other Ambulatory Visit (HOSPITAL_COMMUNITY)
Admission: RE | Admit: 2020-11-18 | Discharge: 2020-11-18 | Disposition: A | Discharge status: Home / Self Care | Payer: 59 | Source: Ambulatory Visit | Attending: Family Medicine | Admitting: Family Medicine

## 2020-11-18 VITALS — BP 120/78 | HR 92 | Temp 99.1°F | Ht 73.0 in | Wt 277.0 lb

## 2020-11-18 DIAGNOSIS — Z0000 Encounter for general adult medical examination without abnormal findings: Secondary | ICD-10-CM

## 2020-11-18 DIAGNOSIS — Z125 Encounter for screening for malignant neoplasm of prostate: Secondary | ICD-10-CM | POA: Diagnosis not present

## 2020-11-18 DIAGNOSIS — G8929 Other chronic pain: Secondary | ICD-10-CM

## 2020-11-18 DIAGNOSIS — Z113 Encounter for screening for infections with a predominantly sexual mode of transmission: Secondary | ICD-10-CM | POA: Insufficient documentation

## 2020-11-18 DIAGNOSIS — M25562 Pain in left knee: Secondary | ICD-10-CM | POA: Diagnosis not present

## 2020-11-18 DIAGNOSIS — Z23 Encounter for immunization: Secondary | ICD-10-CM

## 2020-11-18 DIAGNOSIS — Z114 Encounter for screening for human immunodeficiency virus [HIV]: Secondary | ICD-10-CM | POA: Diagnosis not present

## 2020-11-18 NOTE — Patient Instructions (Addendum)
Give [place] 2-3 business days to get the results of your labs back.   Keep the diet clean and stay active.  The new Shingrix vaccine (for shingles) is a 2 shot series. It can make people feel low energy, achy and almost like they have the flu for 48 hours after injection. Please plan accordingly when deciding on when to get this shot. Call our office for a nurse visit appointment to get this. The second shot of the series is less severe regarding the side effects, but it still lasts 48 hours.   I recommend getting the updated bivalent covid vaccination booster at your convenience.   Let us know if you need anything.

## 2020-11-18 NOTE — Progress Notes (Signed)
Chief Complaint  Patient presents with   Annual Exam    Well Male Adrian Schmidt is here for a complete physical.   His last physical was >1 year ago.  Current diet: in general, a "pretty healthy" diet.  Current exercise: none Weight trend: down a few lbs Fatigue out of ordinary? No. Seat belt? Yes.    Health maintenance Shingrix- No Colonoscopy- Yes Tetanus- Yes HIV- Yes Hep C- Yes  Left knee pain Patient has a history of chronic left knee pain going on for several years.  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  He has done stretches and exercises.  Pain is relatively controlled right now.  He is requesting FMLA forms to allow him to leave work should he need it.   Past Medical History:  Diagnosis Date   Allergic rhinitis 03/23/2008   Allergy    Atypical chest pain    negative stress and 2 D echo 2006   Erectile dysfunction 11/01/2017   GERD (gastroesophageal reflux disease)    Herpes genitalia    Hyperlipidemia 02/24/2008   HYPOGONADISM 02/25/2008   Knee mass, left 06/05/2016   Left knee pain 11/09/2015   NAFLD (nonalcoholic fatty liver disease)    Obesity 02/24/2008   OSA (obstructive sleep apnea) 11/14/2012   HST 11/2012:  AHI 34/hr.   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 Dr. Diona Fanti    Medications  Current Outpatient Medications on File Prior to Visit  Medication Sig Dispense Refill   fluticasone (FLONASE) 50 MCG/ACT nasal spray PLACE 1 SPRAY INTO BOTH NOSTRILS DAILY. 16 mL 1   ketoconazole (NIZORAL) 2 % shampoo APPLY DAILY FOR 3 DAYS AS DIRECTED 120 mL 3   Multiple Vitamin (MULTIVITAMIN) tablet Take 1 tablet by mouth daily.      Omega-3 Fatty Acids (FISH OIL BURP-LESS) 1000 MG CAPS Take 2 g by mouth in the morning and at bedtime. 90 capsule 3   vardenafil (LEVITRA) 20 MG tablet Take 20 mg by mouth daily as needed for erectile dysfunction.     Allergies No Known Allergies  Family History Family History  Problem Relation Age of Onset   Cancer Maternal Grandfather        prostate and colon   Colon cancer Maternal Grandfather    Diabetes Maternal Grandmother    Heart disease Maternal Grandmother        CAD   Esophageal cancer Neg Hx    Rectal cancer Neg Hx    Stomach cancer Neg Hx     Review of Systems: Constitutional:  no fevers Eye:  no recent significant change in vision Ear/Nose/Mouth/Throat:  Ears:  no hearing loss Nose/Mouth/Throat:  no complaints of nasal congestion, no sore throat Cardiovascular:  no chest pain Respiratory:  no shortness of breath Gastrointestinal:  no change in bowel habits GU:  Male: negative for dysuria, frequency Musculoskeletal/Extremities:  no new joint pain Integumentary (Skin/Breast):  no abnormal skin lesions reported Neurologic:  no headaches Endocrine: No unexpected weight changes Hematologic/Lymphatic:  no abnormal bleeding  Exam BP 120/78   Pulse 92   Temp 99.1 F (37.3 C) (Oral)   Ht 6\' 1"  (1.854 m)   Wt 277 lb (  125.6 kg)   SpO2 98%   BMI 36.55 kg/m  General:  well developed, well nourished, in no apparent distress Skin:  no significant moles, warts, or growths Head:  no masses, lesions, or tenderness Eyes:  pupils equal and round, sclera anicteric without injection Ears:  canals without lesions, TMs shiny without retraction, no obvious effusion, no erythema Nose:  nares patent, septum midline, mucosa normal Throat/Pharynx:  lips and gingiva without lesion; tongue and uvula midline; non-inflamed pharynx; no exudates or postnasal drainage Neck: neck supple without adenopathy, thyromegaly, or masses Cardiac: RRR, no bruits, no LE edema Lungs:  clear to  auscultation, breath sounds equal bilaterally, no respiratory distress Abdomen: BS+, soft, non-tender, non-distended, no masses or organomegaly noted Rectal: Deferred Musculoskeletal: Left knee: Normal active and passive range of motion.  Negative Lachman's, varus/valgus stress, patellar apprehension/grind, Stines; otherwise symmetrical muscle groups noted without atrophy or deformity Neuro:  gait normal; deep tendon reflexes normal and symmetric Psych: well oriented with normal range of affect and appropriate judgment/insight  Assessment and Plan  Well adult exam - Plan: CBC, Comprehensive metabolic panel, Lipid panel  Screening for HIV without presence of risk factors - Plan: HIV Antibody (routine testing w rflx)  Routine screening for STI (sexually transmitted infection) - Plan: Urine cytology ancillary only([place])  Screening for prostate cancer - Plan: PSA  Chronic pain of left knee   Well 55 y.o. male. Counseled on diet and exercise. Counseled on risks and benefits of prostate cancer screening with PSA. The patient agrees to undergo testing. Immunizations, labs, and further orders as above.  First Shingrix vaccination today, second in 2 months.  Bivalent COVID vaccination booster recommended. L knee pain: FMLA form filled out, 2 times per 6 weeks excused for 1-2 days.  Continue home exercise program.  Continue Tylenol as needed.  Heat, ice. Follow up in 1 yr for physical or as needed. The patient voiced understanding and agreement to the plan.  [place], DO 11/18/20 3:55 PM

## 2020-11-21 LAB — COMPREHENSIVE METABOLIC PANEL
AG Ratio: 1.4 (calc) (ref 1.0–2.5)
ALT: 56 U/L — ABNORMAL HIGH (ref 9–46)
AST: 33 U/L (ref 10–35)
Albumin: 4.7 g/dL (ref 3.6–5.1)
Alkaline phosphatase (APISO): 65 U/L (ref 35–144)
BUN: 14 mg/dL (ref 7–25)
CO2: 23 mmol/L (ref 20–32)
Calcium: 9.9 mg/dL (ref 8.6–10.3)
Chloride: 103 mmol/L (ref 98–110)
Creat: 1.13 mg/dL (ref 0.70–1.30)
Globulin: 3.3 g/dL (calc) (ref 1.9–3.7)
Glucose, Bld: 80 mg/dL (ref 65–99)
Potassium: 4.1 mmol/L (ref 3.5–5.3)
Sodium: 139 mmol/L (ref 135–146)
Total Bilirubin: 0.7 mg/dL (ref 0.2–1.2)
Total Protein: 8 g/dL (ref 6.1–8.1)

## 2020-11-21 LAB — LIPID PANEL
Cholesterol: 245 mg/dL — ABNORMAL HIGH (ref <200)
HDL: 37 mg/dL — ABNORMAL LOW (ref >=40)
LDL Cholesterol (Calc): 164 mg/dL (calc) — ABNORMAL HIGH
Non-HDL Cholesterol (Calc): 208 mg/dL (calc) — ABNORMAL HIGH (ref <130)
Total CHOL/HDL Ratio: 6.6 (calc) — ABNORMAL HIGH (ref <5.0)
Triglycerides: 266 mg/dL — ABNORMAL HIGH (ref <150)

## 2020-11-21 LAB — CBC
HCT: 45.9 % (ref 38.5–50.0)
Hemoglobin: 16 g/dL (ref 13.2–17.1)
MCH: 30.5 pg (ref 27.0–33.0)
MCHC: 34.9 g/dL (ref 32.0–36.0)
MCV: 87.6 fL (ref 80.0–100.0)
MPV: 10.5 fL (ref 7.5–12.5)
Platelets: 217 10*3/uL (ref 140–400)
RBC: 5.24 10*6/uL (ref 4.20–5.80)
RDW: 13.2 % (ref 11.0–15.0)
WBC: 6.3 10*3/uL (ref 3.8–10.8)

## 2020-11-21 LAB — HIV ANTIBODY (ROUTINE TESTING W REFLEX): HIV 1&2 Ab, 4th Generation: NONREACTIVE

## 2020-11-21 LAB — PSA: PSA: 0.65 ng/mL (ref <=4.00)

## 2020-11-22 LAB — URINE CYTOLOGY ANCILLARY ONLY
Chlamydia: NEGATIVE
Comment: NEGATIVE
Comment: NEGATIVE
Comment: NORMAL
Neisseria Gonorrhea: NEGATIVE
Trichomonas: NEGATIVE

## 2020-12-15 ENCOUNTER — Encounter: Payer: Self-pay | Admitting: Family Medicine

## 2020-12-15 ENCOUNTER — Telehealth: Payer: Self-pay | Admitting: Family Medicine

## 2020-12-15 NOTE — Telephone Encounter (Signed)
Pt dropped off forms for wendling to fill out  Placed into wendling bin up front for completion  Pt would like to be called whenever it is ready to be picked up

## 2020-12-16 NOTE — Telephone Encounter (Signed)
Last OV--11/18/20 No upcoming scheduled.

## 2020-12-16 NOTE — Telephone Encounter (Signed)
Called and scheduled appt. For 12/20/20 at 7:30 AM

## 2020-12-20 ENCOUNTER — Other Ambulatory Visit: Payer: Self-pay | Admitting: Family Medicine

## 2020-12-20 ENCOUNTER — Encounter: Payer: Self-pay | Admitting: Family Medicine

## 2020-12-20 ENCOUNTER — Other Ambulatory Visit: Payer: 59

## 2020-12-20 ENCOUNTER — Ambulatory Visit (INDEPENDENT_AMBULATORY_CARE_PROVIDER_SITE_OTHER): Payer: 59 | Admitting: Family Medicine

## 2020-12-20 VITALS — BP 130/78 | HR 76 | Temp 98.0°F | Ht 74.0 in | Wt 282.4 lb

## 2020-12-20 DIAGNOSIS — M7051 Other bursitis of knee, right knee: Secondary | ICD-10-CM | POA: Diagnosis not present

## 2020-12-20 DIAGNOSIS — R6882 Decreased libido: Secondary | ICD-10-CM

## 2020-12-20 DIAGNOSIS — R7989 Other specified abnormal findings of blood chemistry: Secondary | ICD-10-CM

## 2020-12-20 LAB — TESTOSTERONE: Testosterone: 141.47 ng/dL — ABNORMAL LOW (ref 300.00–890.00)

## 2020-12-20 NOTE — Patient Instructions (Addendum)
We will send form today.  Give [place] 2-3 business days to get the results of your labs back.   Foods that may reduce pain: 1) Ginger 2) Blueberries 3) Salmon 4) Pumpkin seeds 5) dark chocolate 6) turmeric 7) tart cherries 8) virgin olive oil 9) chilli peppers 10) mint  Let us know if you need anything.  Knee Exercises It is normal to feel mild stretching, pulling, tightness, or discomfort as you do these exercises, but you should stop right away if you feel sudden pain or your pain gets worse.  STRETCHING AND RANGE OF MOTION EXERCISES  These exercises warm up your muscles and joints and improve the movement and flexibility of your knee. These exercises also help to relieve pain, numbness, and tingling. Exercise A: Knee Extension, Prone  Lie on your abdomen on a bed. Place your left / right knee just beyond the edge of the surface so your knee is not on the bed. You can put a towel under your left / right thigh just above your knee for comfort. Relax your leg muscles and allow gravity to straighten your knee. You should feel a stretch behind your left / right knee. Hold this position for 30 seconds. Scoot up so your knee is supported between repetitions. Repeat 2 times. Complete this stretch 3 times per week. Exercise B: Knee Flexion, Active     Lie on your back with both knees straight. If this causes back discomfort, bend your left / right knee so your foot is flat on the floor. Slowly slide your left / right heel back toward your buttocks until you feel a gentle stretch in the front of your knee or thigh. Hold this position for 30 seconds. Slowly slide your left / right heel back to the starting position. Repeat 2 times. Complete this exercise 3 times per week. Exercise C: Quadriceps, Prone     Lie on your abdomen on a firm surface, such as a bed or padded floor. Bend your left / right knee and hold your ankle. If you cannot reach your ankle or pant leg, loop a belt around  your foot and grab the belt instead. Gently pull your heel toward your buttocks. Your knee should not slide out to the side. You should feel a stretch in the front of your thigh and knee. Hold this position for 30 seconds. Repeat 2 times. Complete this stretch 3 times per week. Exercise D: Hamstring, Supine  Lie on your back. Loop a belt or towel over the ball of your left / right foot. The ball of your foot is on the walking surface, right under your toes. Straighten your left / right knee and slowly pull on the belt to raise your leg until you feel a gentle stretch behind your knee. Do not let your left / right knee bend while you do this. Keep your other leg flat on the floor. Hold this position for 30 seconds. Repeat 2 times. Complete this stretch 3 times per week. STRENGTHENING EXERCISES  These exercises build strength and endurance in your knee. Endurance is the ability to use your muscles for a long time, even after they get tired. Exercise E: Quadriceps, Isometric     Lie on your back with your left / right leg extended and your other knee bent. Put a rolled towel or small pillow under your knee if told by your health care provider. Slowly tense the muscles in the front of your left / right thigh. You should  see your kneecap slide up toward your hip or see increased dimpling just above the knee. This motion will push the back of the knee toward the floor. For 3 seconds, keep the muscle as tight as you can without increasing your pain. Relax the muscles slowly and completely. Repeat for 10 total reps Repeat 2 ti mes. Complete this exercise 3 times per week. Exercise F: Straight Leg Raises - Quadriceps  Lie on your back with your left / right leg extended and your other knee bent. Tense the muscles in the front of your left / right thigh. You should see your kneecap slide up or see increased dimpling just above the knee. Your thigh may even shake a bit. Keep these muscles tight as you  raise your leg 4-6 inches (10-15 cm) off the floor. Do not let your knee bend. Hold this position for 3 seconds. Keep these muscles tense as you lower your leg. Relax your muscles slowly and completely after each repetition. 10 total reps. Repeat 2 times. Complete this exercise 3 times per week.  Exercise G: Hamstring Curls     If told by your health care provider, do this exercise while wearing ankle weights. Begin with 5 lb weights (optional). Then increase the weight by 1 lb (0.5 kg) increments. Do not wear ankle weights that are more than 20 lbs to start with. Lie on your abdomen with your legs straight. Bend your left / right knee as far as you can without feeling pain. Keep your hips flat against the floor. Hold this position for 3 seconds. Slowly lower your leg to the starting position. Repeat for 10 reps.  Repeat 2 times. Complete this exercise 3 times per week. Exercise H: Squats (Quadriceps)  Stand in front of a table, with your feet and knees pointing straight ahead. You may rest your hands on the table for balance but not for support. Slowly bend your knees and lower your hips like you are going to sit in a chair. Keep your weight over your heels, not over your toes. Keep your lower legs upright so they are parallel with the table legs. Do not let your hips go lower than your knees. Do not bend lower than told by your health care provider. If your knee pain increases, do not bend as low. Hold the squat position for 1 second. Slowly push with your legs to return to standing. Do not use your hands to pull yourself to standing. Repeat 2 times. Complete this exercise 3 times per week. Exercise I: Wall Slides (Quadriceps)     Lean your back against a smooth wall or door while you walk your feet out 18-24 inches (46-61 cm) from it. Place your feet hip-width apart. Slowly slide down the wall or door until your knees Repeat 2 times. Complete this exercise every other day. Exercise  K: Straight Leg Raises - Hip Abductors  Lie on your side with your left / right leg in the top position. Lie so your head, shoulder, knee, and hip line up. You may bend your bottom knee to help you keep your balance. Roll your hips slightly forward so your hips are stacked directly over each other and your left / right knee is facing forward. Leading with your heel, lift your top leg 4-6 inches (10-15 cm). You should feel the muscles in your outer hip lifting. Do not let your foot drift forward. Do not let your knee roll toward the ceiling. Hold this position for 3  seconds. Slowly return your leg to the starting position. Let your muscles relax completely after each repetition. 10 total reps. Repeat 2 times. Complete this exercise 3 times per week. Exercise J: Straight Leg Raises - Hip Extensors  Lie on your abdomen on a firm surface. You can put a pillow under your hips if that is more comfortable. Tense the muscles in your buttocks and lift your left / right leg about 4-6 inches (10-15 cm). Keep your knee straight as you lift your leg. Hold this position for 3 seconds. Slowly lower your leg to the starting position. Let your leg relax completely after each repetition. Repeat 2 times. Complete this exercise 3 times per week. Document Released: 11/01/2004 Document Revised: 09/12/2015 Document Reviewed: 10/24/2014 Elsevier Interactive Patient Education  2017 [employer].

## 2020-12-20 NOTE — Addendum Note (Signed)
Addended by: Manuela Schwartz on: 12/20/2020 02:36 PM   Modules accepted: Orders

## 2020-12-20 NOTE — Progress Notes (Signed)
Chief Complaint  Patient presents with   complete paperwork    Subjective: Patient is a 55 y.o. male here for f/u.  Patient has a several year history dating back to 2019, of infrapatellar bursitis.  Earlier in the year, he had an aspiration and steroid injection which did help to some extent.  He will have intermittent flares.  His job requires him to walk a lot on concrete floors.  He is requesting renewal of FMLA allowing 1 to 2 days every 2 months or so to recover should something happen to his knee.  He takes Tylenol as needed which helps for the most part.  Patient has a history of low libido.  It has progressively been lower as he gets older.  He has never been checked for testosterone and is requesting we do so.  Past Medical History:  Diagnosis Date   Allergic rhinitis 03/23/2008   Allergy    Atypical chest pain    negative stress and 2 D echo 2006   Erectile dysfunction 11/01/2017   GERD (gastroesophageal reflux disease)    Herpes genitalia    Hyperlipidemia 02/24/2008   HYPOGONADISM 02/25/2008   Knee mass, left 06/05/2016   Left knee pain 11/09/2015   NAFLD (nonalcoholic fatty liver disease)    Obesity 02/24/2008   OSA (obstructive sleep apnea) 11/14/2012   HST 11/2012:  AHI 34/hr.   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  Stretches and exercises provided.  Foods with low inflammatory properties provided.  Filled out form today and gave  it to him. New problem to me, we will plan work-up.  Check testosterone.  If normal, will give reassurance.  Weight loss should help. Follow-up as originally scheduled. The patient voiced understanding and agreement to the plan.  [place], DO 12/20/20  9:07 AM

## 2020-12-21 LAB — TESTOSTERONE, FREE

## 2020-12-22 ENCOUNTER — Other Ambulatory Visit: Payer: 59

## 2020-12-22 DIAGNOSIS — R6882 Decreased libido: Secondary | ICD-10-CM

## 2020-12-22 NOTE — Addendum Note (Signed)
Addended by: Manuela Schwartz on: 12/22/2020 01:26 PM   Modules accepted: Orders

## 2020-12-22 NOTE — Progress Notes (Signed)
No charge. 

## 2020-12-26 LAB — TESTOSTERONE, FREE: TESTOSTERONE FREE: 31.6 pg/mL — ABNORMAL LOW (ref 46.0–224.0)

## 2020-12-28 ENCOUNTER — Other Ambulatory Visit: Payer: Self-pay | Admitting: Family Medicine

## 2020-12-28 DIAGNOSIS — R7989 Other specified abnormal findings of blood chemistry: Secondary | ICD-10-CM

## 2021-01-20 ENCOUNTER — Ambulatory Visit (INDEPENDENT_AMBULATORY_CARE_PROVIDER_SITE_OTHER): Payer: 59

## 2021-01-20 DIAGNOSIS — Z23 Encounter for immunization: Secondary | ICD-10-CM | POA: Diagnosis not present

## 2021-01-20 NOTE — Progress Notes (Signed)
Patient received second Shingles vaccine. First given on 11/18/2020- PCP to return 2 to 6 months after first one given. Vaccine given in Right Deltoid and patient tolerated injection well.

## 2021-01-23 ENCOUNTER — Telehealth: Payer: Self-pay | Admitting: Family Medicine

## 2021-01-23 NOTE — Telephone Encounter (Signed)
[facility] need last two office visit notes, and testosterone labs, for referral, please advise.    P #: [id number] ext. [id number] F #: [phone]

## 2021-01-23 NOTE — Telephone Encounter (Signed)
Copied and faxed as requested

## 2021-01-25 ENCOUNTER — Encounter: Payer: Self-pay | Admitting: Family Medicine

## 2021-02-22 ENCOUNTER — Encounter: Payer: Self-pay | Admitting: Family Medicine

## 2021-04-10 ENCOUNTER — Other Ambulatory Visit: Payer: Self-pay | Admitting: Family Medicine

## 2021-07-21 ENCOUNTER — Ambulatory Visit (INDEPENDENT_AMBULATORY_CARE_PROVIDER_SITE_OTHER): Payer: 59 | Admitting: Family Medicine

## 2021-07-21 ENCOUNTER — Encounter: Payer: Self-pay | Admitting: Family Medicine

## 2021-07-21 VITALS — BP 122/82 | HR 79 | Temp 98.8°F | Ht 73.0 in | Wt 283.2 lb

## 2021-07-21 DIAGNOSIS — K219 Gastro-esophageal reflux disease without esophagitis: Secondary | ICD-10-CM | POA: Diagnosis not present

## 2021-07-21 DIAGNOSIS — M79605 Pain in left leg: Secondary | ICD-10-CM | POA: Diagnosis not present

## 2021-07-21 DIAGNOSIS — M79604 Pain in right leg: Secondary | ICD-10-CM

## 2021-07-21 MED ORDER — PANTOPRAZOLE SODIUM 40 MG PO TBEC: 40.0000 mg | DELAYED_RELEASE_TABLET | Freq: Every day | ORAL | 3 refills | Status: DC

## 2021-07-21 NOTE — Patient Instructions (Addendum)
Heat (pad or rice pillow in microwave) over affected area, 10-15 minutes twice daily.   Ice/cold pack over area for 10-15 min twice daily.  The only lifestyle changes that have data behind them are weight loss for the overweight/obese and elevating the head of the bed. Finding out which foods/positions are triggers is important.  Use the Protonix nightly as needed.   OK to take Tylenol 1000 mg (2 extra strength tabs) or 975 mg (3 regular strength tabs) every 6 hours as needed.  Send me a message in a month if you are interested in physical therapy.   Let us know if you need anything.  Stretching and range of motion exercises These exercises warm up your muscles and joints and improve the movement and flexibility of your knee. These exercises also help to relieve pain and stiffness.  Exercise A: Knee flexion, active Lie on your back with both knees straight. If this causes back discomfort, bend your uninjured knee so your foot is flat on the floor. Slowly slide your left / right heel back toward your buttocks until you feel a gentle stretch in the front of your knee or thigh. Stop if you have pain. Hold for3 seconds. Slowly slide your left / right heel back to the starting position. 10 total repetitions. Repeat 2 times. Complete this exercise 3 times a week.  Exercise B: Knee extension, sitting Sit with your left / right heel propped on a chair, a coffee table, or a footstool. Do not have anything under your knee to support it. Allow your leg muscles to relax, letting gravity straighten out your knee. You should feel a stretch behind your left / right knee. If told by your health care provide just above your kneecap. Hold this position for 3 seconds. Repeat for a total of 10 repetitions. Repeat 2 times. Complete this stretch 3 times a week.  Strengthening exercises These exercises build strength and endurance in your knee. Endurance is the ability to use your muscles for a long time,  even after they get tired.  Exercise C: Quadriceps, isometric Lie on your back with your left / right leg extended and your other knee bent. Put a rolled towel or small pillow under your right/left knee if told by your health care provider. Slowly tense the muscles in the front of your left / right thigh by pushing the back of your knee down. You should see your knee cap slide up toward your hip or see increased dimpling just above the knee. For 3 seconds, keep the muscle as tight as you can without increasing your pain. Relax the muscles slowly and completely. Repeat for 10 total repetitions. Repeat 2 times. Complete this exercise 3 times a week. Exercise D: Straight leg raises (quadriceps) Lie on your back with your left / right leg extended and your other knee bent. Tense the muscles in the front of your left / right thigh. You should see your kneecap slide up or see increased dimpling just above the knee. Keep these muscles tight as you raise your leg 4-6 inches (10-15 cm) off the floor. Hold this position for 3 seconds. Keep these muscles tense as you lower your leg. Relax the muscles slowly and completely. Repeat for a total of 10 repetitions. Repeat 2 times. Complete this exercise 3 times a week.  Exercise E: Hamstring curls On the floor or a bed, lie on your abdomen with your legs straight. Put a folded towel or small pillow under your left /  right thigh, just above your kneecap. Slowly bend your left / right knee as far as you can without pain. Keep your hips flat against the floor or bed. Hold this position for 3 seconds. Slowly lower your leg to the starting position. Repeat for a total of 10 repetitions. Repeat 2 times. Complete this exercise 3 times per week.  Stretching exercises These exercises warm up your muscles and joints and improve the movement and flexibility of your knee. These exercises also help to relieve pain and stiffness.  Exercise A: Quadriceps, prone Lie on  your abdomen on a firm surface, such as a bed or padded floor. Bend your left / right knee and hold your ankle. If you cannot reach your ankle or pant leg, loop a belt around your foot and grab the belt instead. Gently pull your heel toward your buttocks. Your knee should not slide out to the side. You should feel a stretch in the front of your thigh and knee. Hold this position for 30 seconds. Repeat 2 times. Complete this stretch 3 times a week.  Exercise B: Hamstring, doorway Lie on your back in front of a doorway with your left / right leg resting against the wall and your other leg flat on the floor in the doorway. There should be a slight bend in your left / right knee. Straighten your left / right knee. You should feel a stretch behind your knee or thigh. If you do not feel that stretch, scoot your buttocks closer to the door. Hold this position for 30 seconds. Repeat 2 times. Complete this stretch 3 times a week.  Strengthening exercises These exercises build strength and endurance in your knee and leg muscles. Endurance is the ability to use your muscles for a long time, even after they get tired.   Exercise D: Wall slides (quadriceps) Lean your back against a smooth wall or door, and walk your feet out 18-24 inches (45-61 cm) from it. Place your feet hip-width apart. Slowly slide down the wall or door until your knees bend 90 degrees. Keep your knees over your heels, not over your toes. Keep your knees in line with your hips. Hold for 2 seconds. Stand up to rest for 60 seconds. Repeat 2 times. Complete this exercise 3 times a week.  Exercise E: Bridge (hip extensors) Lie on your back on a firm surface with your knees bent and your feet flat on the floor. Tighten your buttocks muscles and lift your bottom off the floor until your trunk is level with your thighs. Do not arch your back. You should feel the muscles working in your buttocks and the back of your thighs. Hold this  position for 2 seconds. Slowly lower your hips to the starting position. Let your buttocks muscles relax completely between repetitions. Repeat 2 times. Complete this exercise 3 times a week.

## 2021-07-21 NOTE — Progress Notes (Signed)
Musculoskeletal Exam  Patient: Adrian Schmidt DOB: 04-Oct-1965  DOS: 07/21/2021  SUBJECTIVE:  Chief Complaint:   Chief Complaint  Patient presents with   Leg Pain    Dull pain when waking up Is taking Bayer body and back pain     Adrian Schmidt is a 56 y.o.  male for evaluation and treatment of LE pain.   Onset:  1  yr  ago. No inj or change in activity.  Location: lower extremities b/l below the knees Character:   sore/aching   Progression of issue:  has worsened Worse when he gets up from a seated position or if he stands for a period of time Associated symptoms: no swelling, bruising, redness, decreased ROM Treatment: to date has been OTC NSAIDS.   Neurovascular symptoms: no  GERD Patient has a history of reflux.  He has been having regurgitation and burning in his chest when he eats too late at night.  He also realizes he has to lose weight.  No trouble swallowing, bleeding, abdominal pain, or sore throat.  He has been using Tums with some relief.  He would like something to use as needed.  Past Medical History:  Diagnosis Date   Allergic rhinitis 03/23/2008   Allergy    Atypical chest pain    negative stress and 2 D echo 2006   Erectile dysfunction 11/01/2017   GERD (gastroesophageal reflux disease)    Herpes genitalia    Hyperlipidemia 02/24/2008   HYPOGONADISM 02/25/2008   Knee mass, left 06/05/2016   Left knee pain 11/09/2015   NAFLD (nonalcoholic fatty liver disease)    Obesity 02/24/2008   OSA (obstructive sleep apnea) 11/14/2012   HST 11/2012:  AHI 34/hr.   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 No acute  distress. Thorax & Lungs: No accessory muscle use Musculoskeletal: LE's.   Normal active range of motion: yes.   Normal passive range of motion: yes Tenderness to palpation: Mild TTP over the tibialis anterior bilaterally; no tenderness over the joint line of the knee or patella/patellar tendon Deformity: no Ecchymosis: no There is no joint effusion of the knee, negative Lachman's, Stines, varus/valgus stress, patellar apprehension/grind Neurologic: Normal sensory function. No focal deficits noted. DTR's equal and symmetric in LE's. No clonus. Psychiatric: Normal mood. Age appropriate judgment and insight. Alert & oriented x 3.    Assessment:  Bilateral leg pain  Gastroesophageal reflux disease, unspecified whether esophagitis present  Plan: Stretches/exercises for the knee, heat, ice, Tylenol.  Offered referral to physical therapy, he will try the home stretches first and if no improvement, will send me a message and we will set this up. Chronic, uncontrolled.  Protonix 40 mg daily as needed.  Discussed reflux precautions. F/u as originally scheduled or as needed. The patient voiced understanding and agreement to the plan.   [place], DO 07/21/21  2:31 PM

## 2021-09-25 ENCOUNTER — Other Ambulatory Visit: Payer: Self-pay | Admitting: Family Medicine

## 2021-09-25 ENCOUNTER — Encounter: Payer: Self-pay | Admitting: Family Medicine

## 2021-09-25 DIAGNOSIS — M7051 Other bursitis of knee, right knee: Secondary | ICD-10-CM

## 2021-09-25 MED ORDER — MELOXICAM 15 MG PO TABS: 15.0000 mg | ORAL_TABLET | Freq: Every day | ORAL | 0 refills | Status: DC

## 2021-09-25 MED ORDER — KETOCONAZOLE 2 % EX SHAM: MEDICATED_SHAMPOO | CUTANEOUS | 3 refills | Status: DC

## 2021-10-25 ENCOUNTER — Other Ambulatory Visit: Payer: Self-pay | Admitting: Family Medicine

## 2021-10-25 DIAGNOSIS — M7051 Other bursitis of knee, right knee: Secondary | ICD-10-CM

## 2021-11-10 IMAGING — CT CT CARDIAC CORONARY ARTERY CALCIUM SCORE
3 series · 14 of 20 positions shown, 15 images · non-contrast
Comparison: None.
COMPARISON: None.

Addendum:
EXAM:
OVER-READ INTERPRETATION  CT CHEST

The following report is an over-read performed by radiologist Dr.
Samuel Amoah Bayuo [REDACTED] on 03/16/2019. This
over-read does not include interpretation of cardiac or coronary
anatomy or pathology. The coronary calcium score interpretation by
the cardiologist is attached.
CLINICAL DATA: Risk stratification
Coronary Calcium Score
TECHNIQUE: The patient was scanned on a Siemens Force scanner. Axial
non-contrast 3 mm slices were carried out through the heart. The
data set was analyzed on a dedicated work station and scored using
the Agatson method.

[Series 2: casc 3.0 bv41 2 bestdiast 72 % · axial · 0.36mm/px · z∈[+1156,+1210]mm · 4 of 32 slices shown, 5 images]
[im 7/32  vessel]
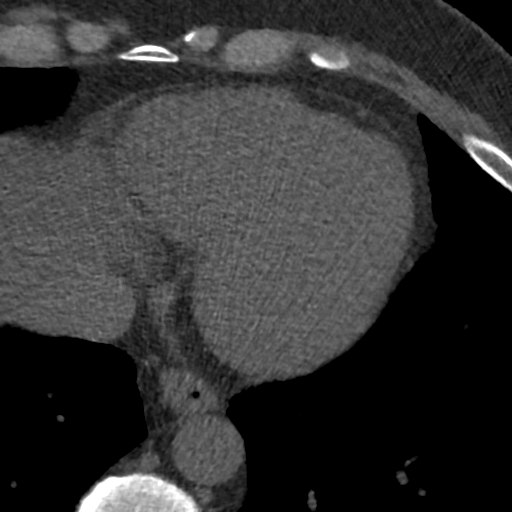
[im 7/32  lung]
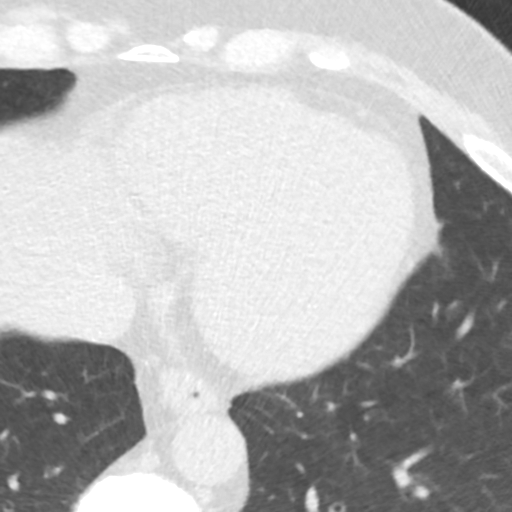
[im 13/32  vessel]
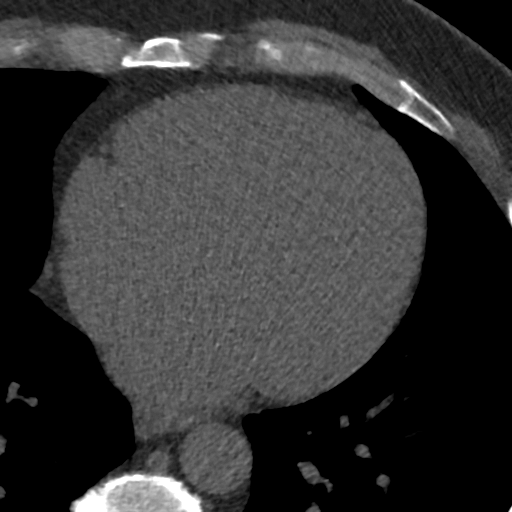
[im 19/32  vessel]
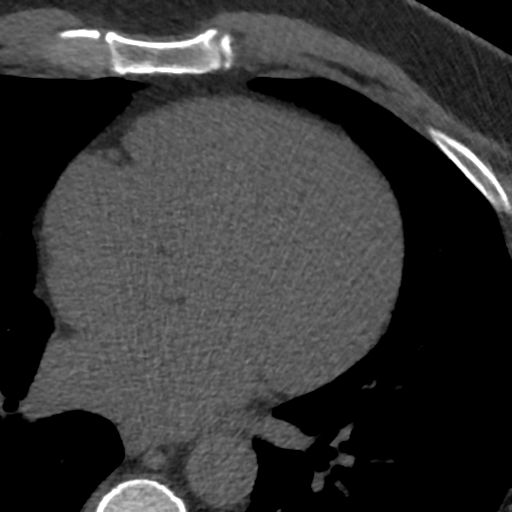
[im 25/32  vessel]
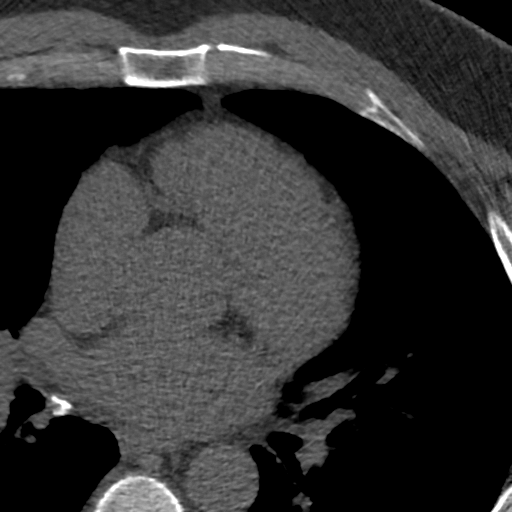

[Series 3: lung 68 % · axial · 0.66mm/px · z∈[+1154,+1214]mm · 5 of 32 slices shown]
[im 6/32  lung]
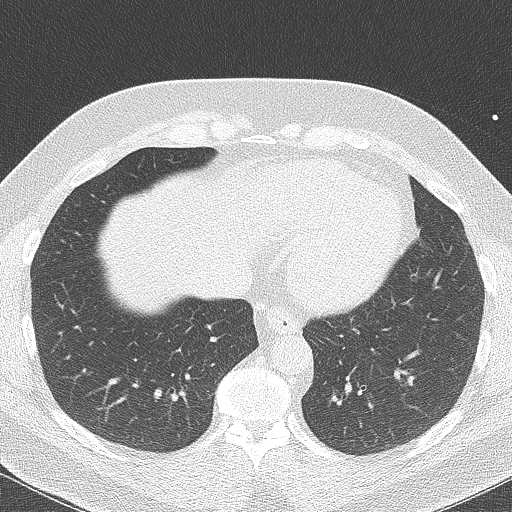
[im 11/32  lung]
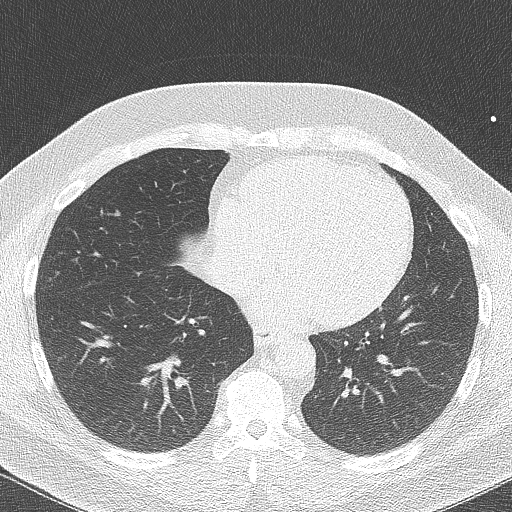
[im 16/32  lung]
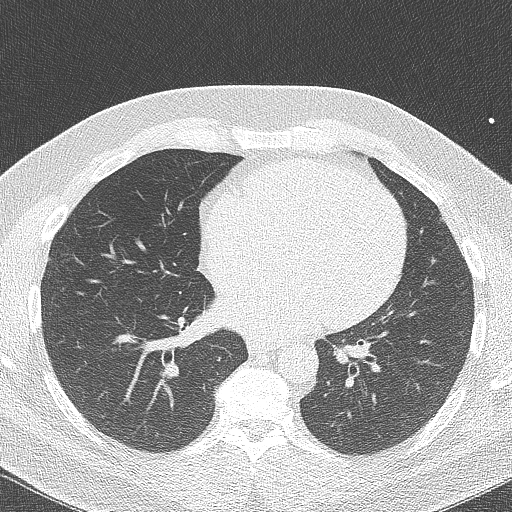
[im 21/32  lung]
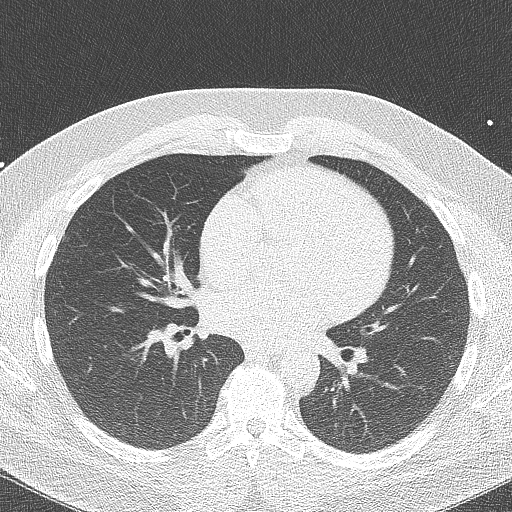
[im 26/32  lung]
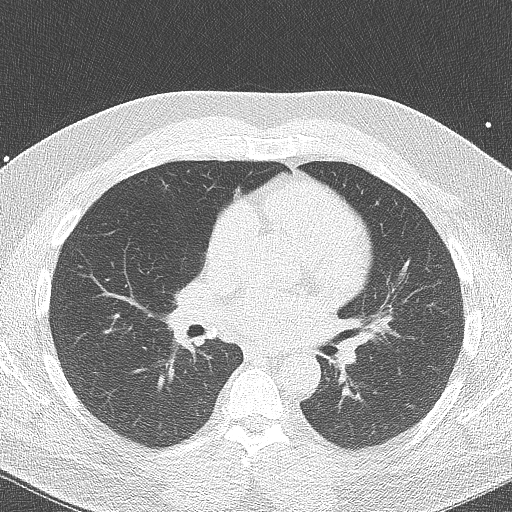

[Series 4: lung st 68 % · axial · 0.66mm/px · z∈[+1154,+1214]mm · 5 of 32 slices shown]
[im 6/32  lung]
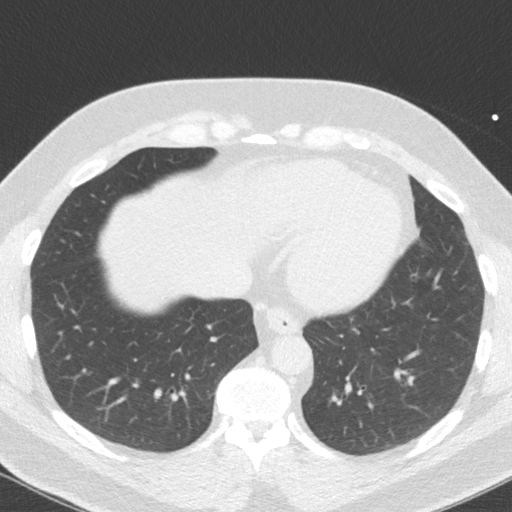
[im 11/32  lung]
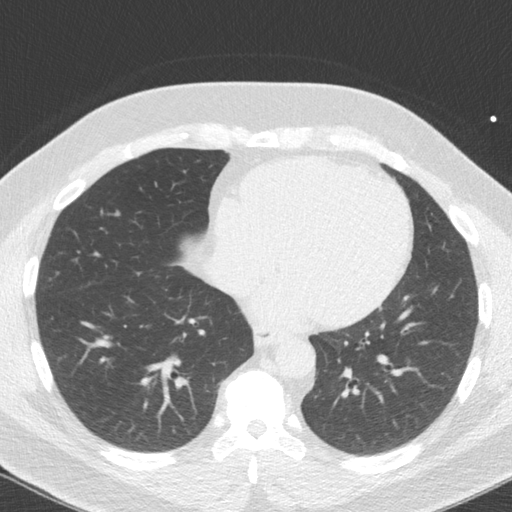
[im 16/32  lung]
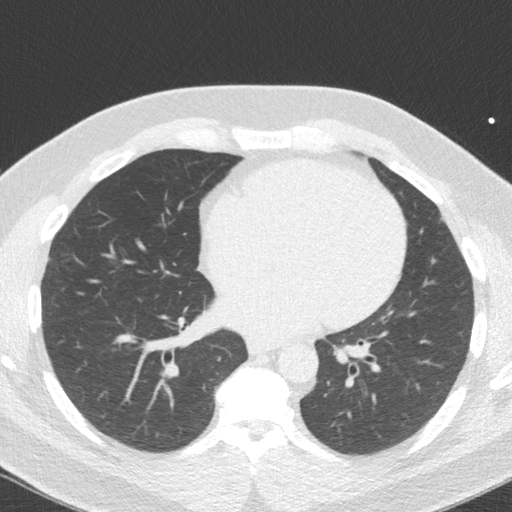
[im 21/32  lung]
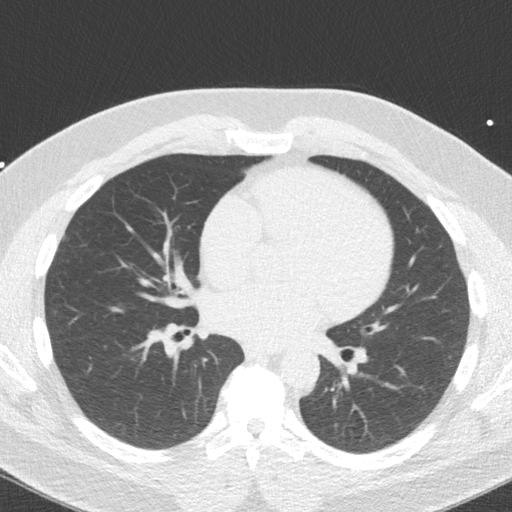
[im 26/32  lung]
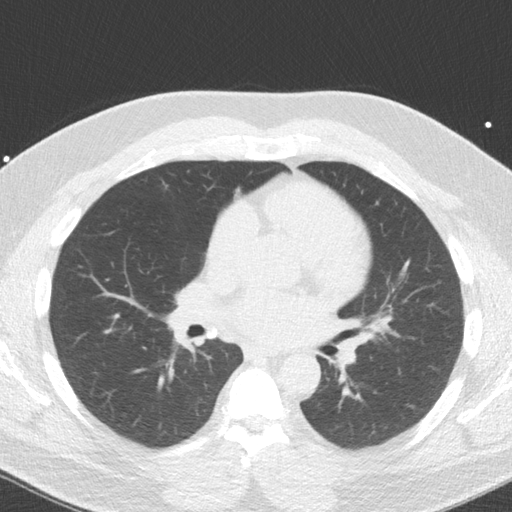

[14 of 20 positions shown; findings below may reference images not displayed]

FINDINGS: Within the visualized portions of the thorax there are no suspicious
appearing pulmonary nodules or masses, there is no acute
consolidative airspace disease, no pleural effusions, no
pneumothorax and no lymphadenopathy. Visualized portions of the
upper abdomen demonstrates diffuse low attenuation throughout the
visualized hepatic parenchyma. There are no aggressive appearing
lytic or blastic lesions noted in the visualized portions of the
skeleton.
IMPRESSION: 1. Hepatic steatosis.
FINDINGS: Non-cardiac: See separate report from [REDACTED].

Ascending Aorta: Normal size, no calcifications.

Pericardium: Normal.

Coronary arteries: Normal origin.
IMPRESSION: Coronary calcium score of 0. This was 0 percentile for age and sex
matched control.

*** End of Addendum ***
EXAM:
OVER-READ INTERPRETATION  CT CHEST

The following report is an over-read performed by radiologist Dr.
Samuel Amoah Bayuo [REDACTED] on 03/16/2019. This
over-read does not include interpretation of cardiac or coronary
anatomy or pathology. The coronary calcium score interpretation by
the cardiologist is attached.
FINDINGS: Within the visualized portions of the thorax there are no suspicious
appearing pulmonary nodules or masses, there is no acute
consolidative airspace disease, no pleural effusions, no
pneumothorax and no lymphadenopathy. Visualized portions of the
upper abdomen demonstrates diffuse low attenuation throughout the
visualized hepatic parenchyma. There are no aggressive appearing
lytic or blastic lesions noted in the visualized portions of the
skeleton.
IMPRESSION: 1. Hepatic steatosis.

## 2021-11-20 ENCOUNTER — Encounter: Payer: Self-pay | Admitting: Family Medicine

## 2021-11-20 ENCOUNTER — Other Ambulatory Visit (HOSPITAL_COMMUNITY)
Admission: RE | Admit: 2021-11-20 | Discharge: 2021-11-20 | Disposition: A | Discharge status: Home / Self Care | Payer: 59 | Source: Ambulatory Visit | Attending: Family Medicine | Admitting: Family Medicine

## 2021-11-20 ENCOUNTER — Ambulatory Visit (INDEPENDENT_AMBULATORY_CARE_PROVIDER_SITE_OTHER): Payer: 59 | Admitting: Family Medicine

## 2021-11-20 VITALS — BP 122/83 | HR 75 | Temp 98.8°F | Resp 18 | Ht 73.0 in | Wt 282.6 lb

## 2021-11-20 DIAGNOSIS — Z114 Encounter for screening for human immunodeficiency virus [HIV]: Secondary | ICD-10-CM

## 2021-11-20 DIAGNOSIS — Z0001 Encounter for general adult medical examination with abnormal findings: Secondary | ICD-10-CM

## 2021-11-20 DIAGNOSIS — Z0000 Encounter for general adult medical examination without abnormal findings: Secondary | ICD-10-CM | POA: Diagnosis not present

## 2021-11-20 DIAGNOSIS — J069 Acute upper respiratory infection, unspecified: Secondary | ICD-10-CM | POA: Diagnosis not present

## 2021-11-20 DIAGNOSIS — Z113 Encounter for screening for infections with a predominantly sexual mode of transmission: Secondary | ICD-10-CM | POA: Insufficient documentation

## 2021-11-20 DIAGNOSIS — Z125 Encounter for screening for malignant neoplasm of prostate: Secondary | ICD-10-CM | POA: Diagnosis not present

## 2021-11-20 DIAGNOSIS — K219 Gastro-esophageal reflux disease without esophagitis: Secondary | ICD-10-CM | POA: Diagnosis not present

## 2021-11-20 LAB — CBC
HCT: 46.4 % (ref 39.0–52.0)
Hemoglobin: 15.9 g/dL (ref 13.0–17.0)
MCHC: 34.2 g/dL (ref 30.0–36.0)
MCV: 92.5 fl (ref 78.0–100.0)
Platelets: 232 10*3/uL (ref 150.0–400.0)
RBC: 5.02 Mil/uL (ref 4.22–5.81)
RDW: 13.3 % (ref 11.5–15.5)
WBC: 6.5 10*3/uL (ref 4.0–10.5)

## 2021-11-20 LAB — PSA: PSA: 0.65 ng/mL (ref 0.10–4.00)

## 2021-11-20 MED ORDER — PROMETHAZINE-DM 6.25-15 MG/5ML PO SYRP: 5.0000 mL | ORAL_SOLUTION | Freq: Four times a day (QID) | ORAL | 0 refills | Status: DC | PRN

## 2021-11-20 NOTE — Progress Notes (Signed)
Chief Complaint  Patient presents with   Annual Exam    Well Male Adrian Schmidt is here for a complete physical.   His last physical was >1 year ago.  Current diet: in general, a "healthy" diet.  Current exercise: walking, cycling Weight trend: stable Fatigue out of ordinary? No. Seat belt? Yes.   Advanced directive? No  Health maintenance Shingrix- Yes Colonoscopy- Yes Tetanus- Yes HIV- Yes Hep C- Yes  GERD Hx of reflux, taking Protonix 40 mg/d prn. Compliant, no AE's. No bleeding, trouble swallowing, ST, pain.   Duration: a few days  Associated symptoms:  coughing Denies: sinus congestion, sinus pain, rhinorrhea, itchy watery eyes, ear pain, ear drainage, sore throat, wheezing, shortness of breath, myalgia, and fevers Treatment to date: none Sick contacts: Yes; various fam members   Past Medical History:  Diagnosis Date   Allergic rhinitis 03/23/2008   Allergy    Atypical chest pain    negative stress and 2 D echo 2006   Erectile dysfunction 11/01/2017   GERD (gastroesophageal reflux disease)    Herpes genitalia    Hyperlipidemia 02/24/2008   HYPOGONADISM 02/25/2008   Knee mass, left 06/05/2016   Left knee pain 11/09/2015   NAFLD (nonalcoholic fatty liver disease)    Obesity 02/24/2008   OSA (obstructive sleep apnea) 11/14/2012   HST 11/2012:  AHI 34/hr.   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 Dr. Diona Fanti    Medications  Current Outpatient Medications on File Prior to Visit  Medication Sig Dispense Refill   fluticasone (FLONASE) 50 MCG/ACT nasal spray SPRAY 1 SPRAY INTO BOTH NOSTRILS DAILY. 16 mL 1   ketoconazole (NIZORAL) 2 % shampoo APPLY DAILY FOR 3 DAYS AS DIRECTED 120 mL 3    meloxicam (MOBIC) 15 MG tablet TAKE 1 TABLET(15 MG) BY MOUTH DAILY 30 tablet 0   Multiple Vitamin (MULTIVITAMIN) tablet Take 1 tablet by mouth daily.     Omega-3 Fatty Acids (FISH OIL BURP-LESS) 1000 MG CAPS Take 2 g by mouth in the morning and at bedtime. 90 capsule 3   pantoprazole (PROTONIX) 40 MG tablet Take 1 tablet (40 mg total) by mouth daily. 30 tablet 3   vardenafil (LEVITRA) 20 MG tablet Take 20 mg by mouth daily as needed for erectile dysfunction.      Allergies No Known Allergies  Family History Family History  Problem Relation Age of Onset   Cancer Maternal Grandfather        prostate and colon   Colon cancer Maternal Grandfather    Diabetes Maternal Grandmother    Heart disease Maternal Grandmother        CAD   Esophageal cancer Neg Hx    Rectal cancer Neg Hx    Stomach cancer Neg Hx     Review of Systems: Constitutional:  no fevers Eye:  no recent significant change in vision Ear/Nose/Mouth/Throat:  Ears:  no hearing loss Nose/Mouth/Throat:  no complaints of nasal congestion, no sore throat Cardiovascular:  no chest pain Respiratory:  no shortness of breath; +cough Gastrointestinal:  no change in bowel habits GU:  Male: negative for dysuria, frequency Musculoskeletal/Extremities:  no new joint pain Integumentary (Skin/Breast):  no abnormal skin lesions reported Neurologic:  no headaches Endocrine:  No unexpected weight changes Hematologic/Lymphatic:  no abnormal bleeding  Exam BP 122/83 (BP Location: Left Arm, Patient Position: Sitting, Cuff Size: Large)   Pulse 75   Temp 98.8 F (37.1 C) (Oral)   Resp 18   Ht '6\' 1"'$  (1.854 m)   Wt 282 lb 9.6 oz (128.2 kg)   SpO2 98%   BMI 37.28 kg/m  General:  well developed, well nourished, in no apparent distress Skin:  no significant moles, warts, or growths Head:  no masses, lesions, or tenderness Eyes:  pupils equal and round, sclera anicteric without injection Ears:  canals without lesions, TMs shiny  without retraction, no obvious effusion, no erythema Nose:  nares patent, mucosa normal Throat/Pharynx:  lips and gingiva without lesion; tongue and uvula midline; non-inflamed pharynx; no exudates or postnasal drainage Neck: neck supple without adenopathy, thyromegaly, or masses Cardiac: RRR, no bruits, no LE edema Lungs:  clear to auscultation, breath sounds equal bilaterally, no respiratory distress Abdomen: BS+, soft, non-tender, non-distended, no masses or organomegaly noted Rectal: Deferred Musculoskeletal:  symmetrical muscle groups noted without atrophy or deformity Neuro:  gait normal; deep tendon reflexes normal and symmetric Psych: well oriented with normal range of affect and appropriate judgment/insight  Assessment and Plan  Well adult exam - Plan: CBC, Comprehensive metabolic panel, Lipid panel  Screening for prostate cancer - Plan: PSA  Screening for HIV without presence of risk factors - Plan: HIV Antibody (routine testing w rflx)  Screening for STD (sexually transmitted disease) - Plan: Urine cytology ancillary only([place])  Gastroesophageal reflux disease, unspecified whether esophagitis present  Viral URI with cough - Plan: promethazine-dextromethorphan (PROMETHAZINE-DM) 6.25-15 MG/5ML syrup   Well 56 y.o. male. Counseled on diet and exercise. Counseled on risks and benefits of prostate cancer screening with PSA. The patient agrees to undergo testing. Immunizations, labs, and further orders as above. GERD- chronic, stable. Cont Protonix 40 mg/d prn. URI- syrup as above. Warned about drowsiness. Hand hygiene rec'd. Seek care if worsening.  Advanced directive form requested today.  Follow up in 1 yr or prn. The patient voiced understanding and agreement to the plan.  [place], DO 11/20/21 10:56 AM

## 2021-11-20 NOTE — Patient Instructions (Signed)
Give [place] 2-3 business days to get the results of your labs back.   Keep the diet clean and stay active.  Please get me a copy of your advanced directive form at your convenience.   Foods that may reduce pain: 1) Ginger 2) Blueberries 3) Salmon 4) Pumpkin seeds 5) dark chocolate 6) turmeric 7) tart cherries 8) virgin olive oil 9) chilli peppers 10) mint 11) krill oil  Let us know if you need anything.

## 2021-11-21 LAB — COMPREHENSIVE METABOLIC PANEL
ALT: 21 U/L (ref 0–53)
AST: 16 U/L (ref 0–37)
Albumin: 4.7 g/dL (ref 3.5–5.2)
Alkaline Phosphatase: 52 U/L (ref 39–117)
BUN: 18 mg/dL (ref 6–23)
CO2: 27 mEq/L (ref 19–32)
Calcium: 9.7 mg/dL (ref 8.4–10.5)
Chloride: 102 mEq/L (ref 96–112)
Creatinine, Ser: 1.11 mg/dL (ref 0.40–1.50)
GFR: 75.11 mL/min (ref >60.00)
Glucose, Bld: 88 mg/dL (ref 70–99)
Potassium: 3.8 mEq/L (ref 3.5–5.1)
Sodium: 141 mEq/L (ref 135–145)
Total Bilirubin: 0.5 mg/dL (ref 0.2–1.2)
Total Protein: 7.8 g/dL (ref 6.0–8.3)

## 2021-11-21 LAB — URINE CYTOLOGY ANCILLARY ONLY
Chlamydia: NEGATIVE
Comment: NEGATIVE
Comment: NEGATIVE
Comment: NORMAL
Neisseria Gonorrhea: NEGATIVE
Trichomonas: NEGATIVE

## 2021-11-21 LAB — LIPID PANEL
Cholesterol: 269 mg/dL — ABNORMAL HIGH (ref 0–200)
HDL: 42.1 mg/dL (ref >39.00)
NonHDL: 227.31
Total CHOL/HDL Ratio: 6
Triglycerides: 275 mg/dL — ABNORMAL HIGH (ref 0.0–149.0)
VLDL: 55 mg/dL — ABNORMAL HIGH (ref 0.0–40.0)

## 2021-11-21 LAB — LDL CHOLESTEROL, DIRECT: Direct LDL: 181 mg/dL

## 2021-11-21 LAB — HIV ANTIBODY (ROUTINE TESTING W REFLEX): HIV 1&2 Ab, 4th Generation: NONREACTIVE

## 2021-11-29 ENCOUNTER — Other Ambulatory Visit: Payer: Self-pay | Admitting: Family Medicine

## 2021-11-29 ENCOUNTER — Other Ambulatory Visit: Payer: Self-pay

## 2021-11-29 DIAGNOSIS — M7051 Other bursitis of knee, right knee: Secondary | ICD-10-CM

## 2021-12-01 ENCOUNTER — Ambulatory Visit: Payer: 59 | Attending: Cardiology | Admitting: Cardiology

## 2021-12-01 ENCOUNTER — Encounter: Payer: Self-pay | Admitting: Cardiology

## 2021-12-01 VITALS — BP 142/92 | HR 75 | Ht 73.0 in | Wt 286.0 lb

## 2021-12-01 DIAGNOSIS — E782 Mixed hyperlipidemia: Secondary | ICD-10-CM

## 2021-12-01 DIAGNOSIS — R030 Elevated blood-pressure reading, without diagnosis of hypertension: Secondary | ICD-10-CM

## 2021-12-01 DIAGNOSIS — G4733 Obstructive sleep apnea (adult) (pediatric): Secondary | ICD-10-CM

## 2021-12-01 DIAGNOSIS — E669 Obesity, unspecified: Secondary | ICD-10-CM | POA: Diagnosis not present

## 2021-12-01 HISTORY — DX: Elevated blood-pressure reading, without diagnosis of hypertension: R03.0

## 2021-12-01 NOTE — Progress Notes (Signed)
Cardiology Office Note:    Date:  12/01/2021   ID:  [name], [name] 19-Aug-1965, MRN [id number]  PCP:  Shelda Pal, DO  Cardiologist:  Jenean Lindau, MD   Referring MD: Shelda Pal*    ASSESSMENT:    1. OSA (obstructive sleep apnea)   2. Mixed dyslipidemia   3. Obesity (BMI 35.0-39.9 without comorbidity)   4. Elevated blood pressure reading in office with white coat syndrome, without diagnosis of hypertension    PLAN:    In order of problems listed above:  Primary prevention stressed with the patient.  Importance of compliance with diet medication stressed and vocalized understanding.  Patient was advised to walk at least 30 to 45 minutes a day or bicycle using stationary bicycle and he vocalized understanding.  Questions were answered to his satisfaction. Elevated blood pressure: He has no known diagnosis of hypertension.  I told him to keep a track of his blood pressures.  Salt intake issues lifestyle modification was discussed and he will send [place] a blood pressure log.  Will intervene if necessary. Mixed dyslipidemia: Markedly elevated lipids.  Diet emphasized.  Lifestyle modification urged.  He promises to do better. Obesity: Weight reduction stressed risks of obesity explained and he promises to do better. Patient will be seen in follow-up appointment in 4 months or earlier if the patient has any concerns.  He will have complete blood work before his next visit.   Medication Adjustments/Labs and Tests Ordered: Current medicines are reviewed at length with the patient today.  Concerns regarding medicines are outlined above.  No orders of the defined types were placed in this encounter.  No orders of the defined types were placed in this encounter.    No chief complaint on file.    History of Present Illness:    Adrian Schmidt is a 56 y.o. male.  Patient has past medical history of mixed dyslipidemia, obesity.  He overall leads a sedentary  lifestyle.  He denies any chest pain orthopnea or PND.  He is not taking the best care of his health.  He has diet which is not the best.  He is here for follow-up.  At the time of my evaluation, the patient is alert awake oriented and in no distress.  Past Medical History:  Diagnosis Date   Allergic rhinitis 03/23/2008   Allergy    Atypical chest pain    negative stress and 2 D echo 2006   Chest discomfort 11/15/2017   Chronic pain of left knee 11/09/2015   Erectile dysfunction 11/01/2017   GERD (gastroesophageal reflux disease)    Herpes genitalia    Hyperlipidemia 02/24/2008   HYPOGONADISM 02/25/2008   Knee mass, left 06/05/2016   Left knee pain 11/09/2015   Mixed dyslipidemia 11/15/2017   NAFLD (nonalcoholic fatty liver disease)    Obesity 02/24/2008   Obesity (BMI 35.0-39.9 without comorbidity) 06/27/2020   OSA (obstructive sleep apnea) 11/14/2012   HST 11/2012:  AHI 34/hr.   Other dysphagia 10/07/2009   Qualifier: Diagnosis of   By: Leonette Nutting of this note might be different from the original.  Overview:   Qualifier: Diagnosis of   By: Wynona Luna   Other fatigue 11/01/2017   Palpitations 03/03/2019   Preventative health care 04/27/2011   Formatting of this note might be different from the original.  Last Assessment & Plan:   Reviewed adult health maintenance protocols.  I encouraged weight loss.  Handout for low saturated fat diet provided.   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  Last Assessment & Plan:   Patient requests STD screening. He experiences intermittent mild dysuria. See orders.   Sleep apnea    CPAP   Sleep paralysis  03/13/2019   Strain of calf muscle, initial encounter 11/15/2016   Viral warts 04/01/2009    Past Surgical History:  Procedure Laterality Date   SMALL INTESTINE SURGERY     stretching of small intestine-- ring removal. Dr. Diona Fanti    Current Medications: Current Meds  Medication Sig   ANDROGEL PUMP 20.25 MG/ACT (1.62%) GEL Apply 1 Application topically daily.   fluticasone (FLONASE) 50 MCG/ACT nasal spray SPRAY 1 SPRAY INTO BOTH NOSTRILS DAILY.   ketoconazole (NIZORAL) 2 % shampoo APPLY DAILY FOR 3 DAYS AS DIRECTED   meloxicam (MOBIC) 15 MG tablet TAKE 1 TABLET(15 MG) BY MOUTH DAILY   Multiple Vitamin (MULTIVITAMIN) tablet Take 1 tablet by mouth daily.   Omega-3 Fatty Acids (FISH OIL BURP-LESS) 1000 MG CAPS Take 2 g by mouth in the morning and at bedtime.   pantoprazole (PROTONIX) 40 MG tablet Take 1 tablet (40 mg total) by mouth daily.   promethazine-dextromethorphan (PROMETHAZINE-DM) 6.25-15 MG/5ML syrup Take 5 mLs by mouth 4 (four) times daily as needed for cough.   sildenafil (VIAGRA) 100 MG tablet Take 100 mg by mouth daily as needed for erectile dysfunction.   vardenafil (LEVITRA) 20 MG tablet Take 20 mg by mouth daily as needed for erectile dysfunction.     Allergies:   Patient has no known allergies.   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[place] [employer]  Tobacco Use   Smoking status: Never   Smokeless tobacco: Never  Vaping Use   Vaping Use: Never used  Substance and Sexual Activity   Alcohol use: Yes    Comment: socially   Drug use: No   Sexual activity: Not on file  Other Topics Concern   Not on file  Social History Narrative   Regular Exercise:  yes   Social Determinants of Health   Financial Resource Strain: Not on file  Food Insecurity:  Not on file  Transportation Needs: Not on file  Physical Activity: Not on file  Stress: Not on file  Social Connections: Not on file     Family History: The patient's family history includes Cancer in his maternal grandfather; Colon cancer in his maternal grandfather; Diabetes in his maternal grandmother; Heart disease in his maternal grandmother. There is no history of Esophageal cancer, Rectal cancer, or Stomach cancer.  ROS:   Please see the history of present illness.    All other systems reviewed and are negative.  EKGs/Labs/Other Studies Reviewed:    The following studies were reviewed today: EKG reveals sinus rhythm and nonspecific ST-T changes   Recent Labs: 11/20/2021: ALT 21; BUN 18; Creatinine, Ser 1.11; Hemoglobin  15.9; Platelets 232.0; Potassium 3.8; Sodium 141  Recent Lipid Panel    Component Value Date/Time   CHOL 269 (H) 11/20/2021 1108   TRIG 275.0 (H) 11/20/2021 1108   HDL 42.10 11/20/2021 1108   CHOLHDL 6 11/20/2021 1108   VLDL 55.0 (H) 11/20/2021 1108   LDLCALC 164 (H) 11/18/2020 1601   LDLDIRECT 181.0 11/20/2021 1108    Physical Exam:    VS:  BP (!) 142/92 (BP Location: Left Arm, Patient Position: Sitting, Cuff Size: Large)   Pulse 75   Ht '6\' 1"'$  (1.854 m)   Wt 286 lb (129.7 kg)   SpO2 96%   BMI 37.73 kg/m     Wt Readings from Last 3 Encounters:  12/01/21 286 lb (129.7 kg)  11/20/21 282 lb 9.6 oz (128.2 kg)  07/21/21 283 lb 4 oz (128.5 kg)     GEN: Patient is in no acute distress HEENT: Normal NECK: No JVD; No carotid bruits LYMPHATICS: No lymphadenopathy CARDIAC: Hear sounds regular, 2/6 systolic murmur at the apex. RESPIRATORY:  Clear to auscultation without rales, wheezing or rhonchi  ABDOMEN: Soft, non-tender, non-distended MUSCULOSKELETAL:  No edema; No deformity  SKIN: Warm and dry NEUROLOGIC:  Alert and oriented x 3 PSYCHIATRIC:  Normal affect   Signed, Jenean Lindau, MD  12/01/2021 4:22 PM    [facility]

## 2021-12-01 NOTE — Patient Instructions (Signed)
Medication Instructions:  Your physician recommends that you continue on your current medications as directed. Please refer to the Current Medication list given to you today.  *If you need a refill on your cardiac medications before your next appointment, please call your pharmacy*   Lab Work: Your physician recommends that you return for lab work in:   Labs before next visit in 3 months: BMP, LFT, Lipid  If you have labs (blood work) drawn today and your tests are completely normal, you will receive your results only by: [place] (if you have MyChart) OR A paper copy in the mail If you have any lab test that is abnormal or we need to change your treatment, we will call you to review the results.   Testing/Procedures: None   Follow-Up: At [facility], you and your health needs are our priority.  As part of our continuing mission to provide you with exceptional heart care, we have created designated Provider Care Teams.  These Care Teams include your primary Cardiologist (physician) and Advanced Practice Providers (APPs -  Physician Assistants and Nurse Practitioners) who all work together to provide you with the care you need, when you need it.  We recommend signing up for the patient portal called "MyChart".  Sign up information is provided on this After Visit Summary.  MyChart is used to connect with patients for Virtual Visits (Telemedicine).  Patients are able to view lab/test results, encounter notes, upcoming appointments, etc.  Non-urgent messages can be sent to your provider as well.   To learn more about what you can do with MyChart, go to [email or web address].    Your next appointment:   3 month(s)  The format for your next appointment:   In Person  Provider:   Jyl Heinz, MD    Other Instructions None  Important Information About Sugar

## 2021-12-01 NOTE — Addendum Note (Signed)
Addended by: Edwyna Shell I on: 12/01/2021 04:36 PM   Modules accepted: Orders

## 2022-02-14 ENCOUNTER — Encounter: Payer: Self-pay | Admitting: Family Medicine

## 2022-02-21 ENCOUNTER — Ambulatory Visit (INDEPENDENT_AMBULATORY_CARE_PROVIDER_SITE_OTHER): Payer: 59 | Admitting: Family Medicine

## 2022-02-21 ENCOUNTER — Encounter: Payer: Self-pay | Admitting: Family Medicine

## 2022-02-21 VITALS — BP 114/76 | HR 66 | Temp 98.4°F | Ht 73.0 in | Wt 284.5 lb

## 2022-02-21 DIAGNOSIS — R5383 Other fatigue: Secondary | ICD-10-CM | POA: Diagnosis not present

## 2022-02-21 DIAGNOSIS — M545 Low back pain, unspecified: Secondary | ICD-10-CM | POA: Diagnosis not present

## 2022-02-21 LAB — CBC
HCT: 46.6 % (ref 39.0–52.0)
Hemoglobin: 15.8 g/dL (ref 13.0–17.0)
MCHC: 33.9 g/dL (ref 30.0–36.0)
MCV: 90.4 fl (ref 78.0–100.0)
Platelets: 217 10*3/uL (ref 150.0–400.0)
RBC: 5.15 Mil/uL (ref 4.22–5.81)
RDW: 14.2 % (ref 11.5–15.5)
WBC: 5.8 10*3/uL (ref 4.0–10.5)

## 2022-02-21 LAB — TSH: TSH: 0.73 u[IU]/mL (ref 0.35–5.50)

## 2022-02-21 LAB — HEMOGLOBIN A1C: Hgb A1c MFr Bld: 5.5 % (ref 4.6–6.5)

## 2022-02-21 LAB — VITAMIN D 25 HYDROXY (VIT D DEFICIENCY, FRACTURES): VITD: 11.53 ng/mL — ABNORMAL LOW (ref 30.00–100.00)

## 2022-02-21 MED ORDER — KETOCONAZOLE 2 % EX SHAM: MEDICATED_SHAMPOO | CUTANEOUS | 3 refills | Status: DC

## 2022-02-21 MED ORDER — FLUTICASONE PROPIONATE 50 MCG/ACT NA SUSP: NASAL | 1 refills | Status: DC

## 2022-02-21 MED ORDER — TIZANIDINE HCL 4 MG PO TABS: 4.0000 mg | ORAL_TABLET | Freq: Four times a day (QID) | ORAL | 0 refills | Status: DC | PRN

## 2022-02-21 NOTE — Progress Notes (Signed)
Musculoskeletal Exam  Patient: Adrian Schmidt DOB: [birth year]  DOS: 02/21/2022  SUBJECTIVE:  Chief Complaint:   Chief Complaint  Patient presents with   Foot Pain   Ankle Pain    both    Adrian Schmidt is a 57 y.o.  male for evaluation and treatment of back pain.   Onset:  1 week ago. No inj or change in activity.  Location: lower mid back Character:  aching and shooting  Progression of issue:  has slightly improved Associated symptoms: pain in both feet Denies swelling, bruising, redness, bowel/bladder incontinence or weakness Treatment: to date has been OTC NSAIDS, acetaminophen, home exercises, and heat.   Neurovascular symptoms: no  Fatigue Over the past several months, the patient has been having more fatigue.  He reports feeling he is operating at "50%".  He does have a history of sleep apnea and is compliant with the CPAP.  Settings have not been updated in nearly 2 years and he has an appointment with his sleep doctor in a couple weeks.  Mood is stable overall.  Diet is okay.  He does not exercise routinely.  He tries to stay hydrated.  Past Medical History:  Diagnosis Date   Allergic rhinitis 03/23/2008   Allergy    Atypical chest pain    negative stress and 2 D echo 2006   Chest discomfort 11/15/2017   Chronic pain of left knee 11/09/2015   Erectile dysfunction 11/01/2017   GERD (gastroesophageal reflux disease)    Herpes genitalia    Hyperlipidemia 02/24/2008   HYPOGONADISM 02/25/2008   Knee mass, left 06/05/2016   Left knee pain 11/09/2015   Mixed dyslipidemia 11/15/2017   NAFLD (nonalcoholic fatty liver disease)    Obesity 02/24/2008   Obesity (BMI 35.0-39.9 without comorbidity) 06/27/2020   OSA (obstructive sleep apnea) 11/14/2012   HST 11/2012:  AHI 34/hr.   Other dysphagia 10/07/2009   Qualifier: Diagnosis of   By: Leonette Nutting of this note might be different from the original.  Overview:   Qualifier: Diagnosis of   By: Wynona Luna   Other fatigue 11/01/2017   Palpitations 03/03/2019   Preventative health care 04/27/2011   Formatting of this note might be different from the original.  Last Assessment & Plan:   Reviewed adult health maintenance protocols.  I encouraged weight loss.  Handout for low saturated fat diet provided.   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  Last Assessment & Plan:   Patient requests STD screening. He experiences intermittent mild dysuria. See orders.   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 No acute distress. HENT: Normocephalic, atraumatic.  Heart: RRR Thorax &  Lungs:  CTAB. No accessory muscle use Musculoskeletal: low back.   Tenderness to palpation: no Deformity: no Ecchymosis: no Straight leg test: negative for Poor hamstring flexibility b/l. Neurologic: Normal sensory function. No focal deficits noted. DTR's equal and symmetric in LE's. No clonus.  5/5 strength in lower extremities bilaterally.  Gait is normal. Psychiatric: Normal mood. Age appropriate judgment and insight. Alert & oriented x 3.    Assessment:  Acute midline low back pain without sciatica - Plan: tiZANidine (ZANAFLEX) 4 MG tablet  Fatigue, unspecified type - Plan: CBC, Comprehensive metabolic panel, Hemoglobin A1c, VITAMIN D 25  Hydroxy (Vit-D Deficiency, Fractures), TSH  Plan: 1. Zanaflex prn. Warned about possible drowsiness. Stretches/exercises, heat, ice, Tylenol, NSAIDs. 2. Stay hydrated. Ck labs. May be related to OSA.  Does not seem like anxiety/depression is playing a large role. F/u prn. The patient voiced understanding and agreement to the plan.  I spent 30 min w pt discussing the above plans in addition to reviewing his chart on the same day of the visit.  [place], DO 02/21/22  11:47 AM

## 2022-02-21 NOTE — Patient Instructions (Addendum)
Give [place] 2-3 business days to get the results of your labs back.   Heat (pad or rice pillow in microwave) over affected area, 10-15 minutes twice daily.   Ice/cold pack over area for 10-15 min twice daily.  OK to take Tylenol 1000 mg (2 extra strength tabs) or 975 mg (3 regular strength tabs) every 6 hours as needed.  Ibuprofen 600 mg (3 over the counter strength tabs) every 6 hours as needed for pain.  Take Zanaflex 3-4 hours before planned bedtime. If it makes you drowsy, do not take during the day. You can try half a tab the following night.  Let us know if you need anything.  EXERCISES  RANGE OF MOTION (ROM) AND STRETCHING EXERCISES - Low Back Pain Most people with lower back pain will find that their symptoms get worse with excessive bending forward (flexion) or arching at the lower back (extension). The exercises that will help resolve your symptoms will focus on the opposite motion.  If you have pain, numbness or tingling which travels down into your buttocks, leg or foot, the goal of the therapy is for these symptoms to move closer to your back and eventually resolve. Sometimes, these leg symptoms will get better, but your lower back pain may worsen. This is often an indication of progress in your rehabilitation. Be very alert to any changes in your symptoms and the activities in which you participated in the 24 hours prior to the change. Sharing this information with your caregiver will allow him or her to most efficiently treat your condition. These exercises may help you when beginning to rehabilitate your injury. Your symptoms may resolve with or without further involvement from your physician, physical therapist or athletic trainer. While completing these exercises, remember:  Restoring tissue flexibility helps normal motion to return to the joints. This allows healthier, less painful movement and activity. An effective stretch should be held for at least 30 seconds. A stretch should  never be painful. You should only feel a gentle lengthening or release in the stretched tissue. FLEXION RANGE OF MOTION AND STRETCHING EXERCISES:  STRETCH - Flexion, Single Knee to Chest  Lie on a firm bed or floor with both legs extended in front of you. Keeping one leg in contact with the floor, bring your opposite knee to your chest. Hold your leg in place by either grabbing behind your thigh or at your knee. Pull until you feel a gentle stretch in your low back. Hold 30 seconds. Slowly release your grasp and repeat the exercise with the opposite side. Repeat 2 times. Complete this exercise 3 times per week.   STRETCH - Flexion, Double Knee to Chest Lie on a firm bed or floor with both legs extended in front of you. Keeping one leg in contact with the floor, bring your opposite knee to your chest. Tense your stomach muscles to support your back and then lift your other knee to your chest. Hold your legs in place by either grabbing behind your thighs or at your knees. Pull both knees toward your chest until you feel a gentle stretch in your low back. Hold 30 seconds. Tense your stomach muscles and slowly return one leg at a time to the floor. Repeat 2 times. Complete this exercise 3 times per week.   STRETCH - Low Trunk Rotation Lie on a firm bed or floor. Keeping your legs in front of you, bend your knees so they are both pointed toward the ceiling and your feet  are flat on the floor. Extend your arms out to the side. This will stabilize your upper body by keeping your shoulders in contact with the floor. Gently and slowly drop both knees together to one side until you feel a gentle stretch in your low back. Hold for 30 seconds. Tense your stomach muscles to support your lower back as you bring your knees back to the starting position. Repeat the exercise to the other side. Repeat 2 times. Complete this exercise at least 3 times per week.   EXTENSION RANGE OF MOTION AND FLEXIBILITY  EXERCISES:  STRETCH - Extension, Prone on Elbows  Lie on your stomach on the floor, a bed will be too soft. Place your palms about shoulder width apart and at the height of your head. Place your elbows under your shoulders. If this is too painful, stack pillows under your chest. Allow your body to relax so that your hips drop lower and make contact more completely with the floor. Hold this position for 30 seconds. Slowly return to lying flat on the floor. Repeat 2 times. Complete this exercise 3 times per week.   RANGE OF MOTION - Extension, Prone Press Ups Lie on your stomach on the floor, a bed will be too soft. Place your palms about shoulder width apart and at the height of your head. Keeping your back as relaxed as possible, slowly straighten your elbows while keeping your hips on the floor. You may adjust the placement of your hands to maximize your comfort. As you gain motion, your hands will come more underneath your shoulders. Hold this position 30 seconds. Slowly return to lying flat on the floor. Repeat 2 times. Complete this exercise 3 times per week.   RANGE OF MOTION- Quadruped, Neutral Spine  Assume a hands and knees position on a firm surface. Keep your hands under your shoulders and your knees under your hips. You may place padding under your knees for comfort. Drop your head and point your tailbone toward the ground below you. This will round out your lower back like an angry cat. Hold this position for 30 seconds. Slowly lift your head and release your tail bone so that your back sags into a large arch, like an old horse. Hold this position for 30 seconds. Repeat this until you feel limber in your low back. Now, find your "sweet spot." This will be the most comfortable position somewhere between the two previous positions. This is your neutral spine. Once you have found this position, tense your stomach muscles to support your low back. Hold this position for 30  seconds. Repeat 2 times. Complete this exercise 3 times per week.   STRENGTHENING EXERCISES - Low Back Sprain These exercises may help you when beginning to rehabilitate your injury. These exercises should be done near your "sweet spot." This is the neutral, low-back arch, somewhere between fully rounded and fully arched, that is your least painful position. When performed in this safe range of motion, these exercises can be used for people who have either a flexion or extension based injury. These exercises may resolve your symptoms with or without further involvement from your physician, physical therapist or athletic trainer. While completing these exercises, remember:  Muscles can gain both the endurance and the strength needed for everyday activities through controlled exercises. Complete these exercises as instructed by your physician, physical therapist or athletic trainer. Increase the resistance and repetitions only as guided. You may experience muscle soreness or fatigue, but the pain  or discomfort you are trying to eliminate should never worsen during these exercises. If this pain does worsen, stop and make certain you are following the directions exactly. If the pain is still present after adjustments, discontinue the exercise until you can discuss the trouble with your caregiver.  STRENGTHENING - Deep Abdominals, Pelvic Tilt  Lie on a firm bed or floor. Keeping your legs in front of you, bend your knees so they are both pointed toward the ceiling and your feet are flat on the floor. Tense your lower abdominal muscles to press your low back into the floor. This motion will rotate your pelvis so that your tail bone is scooping upwards rather than pointing at your feet or into the floor. With a gentle tension and even breathing, hold this position for 3 seconds. Repeat 2 times. Complete this exercise 3 times per week.   STRENGTHENING - Abdominals, Crunches  Lie on a firm bed or floor.  Keeping your legs in front of you, bend your knees so they are both pointed toward the ceiling and your feet are flat on the floor. Cross your arms over your chest. Slightly tip your chin down without bending your neck. Tense your abdominals and slowly lift your trunk high enough to just clear your shoulder blades. Lifting higher can put excessive stress on the lower back and does not further strengthen your abdominal muscles. Control your return to the starting position. Repeat 2 times. Complete this exercise 3 times per week.   STRENGTHENING - Quadruped, Opposite UE/LE Lift  Assume a hands and knees position on a firm surface. Keep your hands under your shoulders and your knees under your hips. You may place padding under your knees for comfort. Find your neutral spine and gently tense your abdominal muscles so that you can maintain this position. Your shoulders and hips should form a rectangle that is parallel with the floor and is not twisted. Keeping your trunk steady, lift your right hand no higher than your shoulder and then your left leg no higher than your hip. Make sure you are not holding your breath. Hold this position for 30 seconds. Continuing to keep your abdominal muscles tense and your back steady, slowly return to your starting position. Repeat with the opposite arm and leg. Repeat 2 times. Complete this exercise 3 times per week.   STRENGTHENING - Abdominals and Quadriceps, Straight Leg Raise  Lie on a firm bed or floor with both legs extended in front of you. Keeping one leg in contact with the floor, bend the other knee so that your foot can rest flat on the floor. Find your neutral spine, and tense your abdominal muscles to maintain your spinal position throughout the exercise. Slowly lift your straight leg off the floor about 6 inches for a count of 3, making sure to not hold your breath. Still keeping your neutral spine, slowly lower your leg all the way to the  floor. Repeat this exercise with each leg 2 times. Complete this exercise 3 times per week.  POSTURE AND BODY MECHANICS CONSIDERATIONS - Low Back Sprain Keeping correct posture when sitting, standing or completing your activities will reduce the stress put on different body tissues, allowing injured tissues a chance to heal and limiting painful experiences. The following are general guidelines for improved posture.  While reading these guidelines, remember: The exercises prescribed by your provider will help you have the flexibility and strength to maintain correct postures. The correct posture provides the best environment  for your joints to work. All of your joints have less wear and tear when properly supported by a spine with good posture. This means you will experience a healthier, less painful body. Correct posture must be practiced with all of your activities, especially prolonged sitting and standing. Correct posture is as important when doing repetitive low-stress activities (typing) as it is when doing a single heavy-load activity (lifting).  RESTING POSITIONS Consider which positions are most painful for you when choosing a resting position. If you have pain with flexion-based activities (sitting, bending, stooping, squatting), choose a position that allows you to rest in a less flexed posture. You would want to avoid curling into a fetal position on your side. If your pain worsens with extension-based activities (prolonged standing, working overhead), avoid resting in an extended position such as sleeping on your stomach. Most people will find more comfort when they rest with their spine in a more neutral position, neither too rounded nor too arched. Lying on a non-sagging bed on your side with a pillow between your knees, or on your back with a pillow under your knees will often provide some relief. Keep in mind, being in any one position for a prolonged period of time, no matter how correct  your posture, can still lead to stiffness.  PROPER SITTING POSTURE In order to minimize stress and discomfort on your spine, you must sit with correct posture. Sitting with good posture should be effortless for a healthy body. Returning to good posture is a gradual process. Many people can work toward this most comfortably by using various supports until they have the flexibility and strength to maintain this posture on their own. When sitting with proper posture, your ears will fall over your shoulders and your shoulders will fall over your hips. You should use the back of the chair to support your upper back. Your lower back will be in a neutral position, just slightly arched. You may place a small pillow or folded towel at the base of your lower back for  support.  When working at a desk, create an environment that supports good, upright posture. Without extra support, muscles tire, which leads to excessive strain on joints and other tissues. Keep these recommendations in mind:  CHAIR: A chair should be able to slide under your desk when your back makes contact with the back of the chair. This allows you to work closely. The chair's height should allow your eyes to be level with the upper part of your monitor and your hands to be slightly lower than your elbows.  BODY POSITION Your feet should make contact with the floor. If this is not possible, use a foot rest. Keep your ears over your shoulders. This will reduce stress on your neck and low back.  INCORRECT SITTING POSTURES  If you are feeling tired and unable to assume a healthy sitting posture, do not slouch or slump. This puts excessive strain on your back tissues, causing more damage and pain. Healthier options include: Using more support, like a lumbar pillow. Switching tasks to something that requires you to be upright or walking. Talking a brief walk. Lying down to rest in a neutral-spine position.  PROLONGED STANDING WHILE  SLIGHTLY LEANING FORWARD  When completing a task that requires you to lean forward while standing in one place for a long time, place either foot up on a stationary 2-4 inch high object to help maintain the best posture. When both feet are on the ground,  the lower back tends to lose its slight inward curve. If this curve flattens (or becomes too large), then the back and your other joints will experience too much stress, tire more quickly, and can cause pain.  CORRECT STANDING POSTURES Proper standing posture should be assumed with all daily activities, even if they only take a few moments, like when brushing your teeth. As in sitting, your ears should fall over your shoulders and your shoulders should fall over your hips. You should keep a slight tension in your abdominal muscles to brace your spine. Your tailbone should point down to the ground, not behind your body, resulting in an over-extended swayback posture.   INCORRECT STANDING POSTURES  Common incorrect standing postures include a forward head, locked knees and/or an excessive swayback. WALKING Walk with an upright posture. Your ears, shoulders and hips should all line-up.  PROLONGED ACTIVITY IN A FLEXED POSITION When completing a task that requires you to bend forward at your waist or lean over a low surface, try to find a way to stabilize 3 out of 4 of your limbs. You can place a hand or elbow on your thigh or rest a knee on the surface you are reaching across. This will provide you more stability, so that your muscles do not tire as quickly. By keeping your knees relaxed, or slightly bent, you will also reduce stress across your lower back. CORRECT LIFTING TECHNIQUES  DO : Assume a wide stance. This will provide you more stability and the opportunity to get as close as possible to the object which you are lifting. Tense your abdominals to brace your spine. Bend at the knees and hips. Keeping your back locked in a neutral-spine position,  lift using your leg muscles. Lift with your legs, keeping your back straight. Test the weight of unknown objects before attempting to lift them. Try to keep your elbows locked down at your sides in order get the best strength from your shoulders when carrying an object.   Always ask for help when lifting heavy or awkward objects. INCORRECT LIFTING TECHNIQUES DO NOT:  Lock your knees when lifting, even if it is a small object. Bend and twist. Pivot at your feet or move your feet when needing to change directions. Assume that you can safely pick up even a paperclip without proper posture.

## 2022-02-22 ENCOUNTER — Other Ambulatory Visit: Payer: Self-pay | Admitting: Family Medicine

## 2022-02-22 LAB — COMPREHENSIVE METABOLIC PANEL
ALT: 36 U/L (ref 0–53)
AST: 22 U/L (ref 0–37)
Albumin: 4.9 g/dL (ref 3.5–5.2)
Alkaline Phosphatase: 58 U/L (ref 39–117)
BUN: 18 mg/dL (ref 6–23)
CO2: 29 mEq/L (ref 19–32)
Calcium: 10.4 mg/dL (ref 8.4–10.5)
Chloride: 102 mEq/L (ref 96–112)
Creatinine, Ser: 1.21 mg/dL (ref 0.40–1.50)
GFR: 67.6 mL/min (ref >60.00)
Glucose, Bld: 93 mg/dL (ref 70–99)
Potassium: 4.5 mEq/L (ref 3.5–5.1)
Sodium: 139 mEq/L (ref 135–145)
Total Bilirubin: 0.7 mg/dL (ref 0.2–1.2)
Total Protein: 7.9 g/dL (ref 6.0–8.3)

## 2022-02-22 MED ORDER — VITAMIN D (ERGOCALCIFEROL) 1.25 MG (50000 UNIT) PO CAPS: 50000.0000 [IU] | ORAL_CAPSULE | ORAL | 0 refills | Status: DC

## 2022-02-23 ENCOUNTER — Other Ambulatory Visit: Payer: Self-pay | Admitting: Family Medicine

## 2022-02-23 DIAGNOSIS — E559 Vitamin D deficiency, unspecified: Secondary | ICD-10-CM

## 2022-02-27 ENCOUNTER — Encounter: Payer: Self-pay | Admitting: Family Medicine

## 2022-03-07 ENCOUNTER — Ambulatory Visit (INDEPENDENT_AMBULATORY_CARE_PROVIDER_SITE_OTHER): Payer: 59 | Admitting: Pulmonary Disease

## 2022-03-07 ENCOUNTER — Encounter (HOSPITAL_BASED_OUTPATIENT_CLINIC_OR_DEPARTMENT_OTHER): Payer: Self-pay | Admitting: Pulmonary Disease

## 2022-03-07 VITALS — BP 102/82 | HR 74 | Ht 73.0 in | Wt 282.2 lb

## 2022-03-07 DIAGNOSIS — G4733 Obstructive sleep apnea (adult) (pediatric): Secondary | ICD-10-CM | POA: Diagnosis not present

## 2022-03-07 DIAGNOSIS — G478 Other sleep disorders: Secondary | ICD-10-CM

## 2022-03-07 NOTE — Patient Instructions (Signed)
Will arrange for new medical supply company to get a new auto CPAP machine  Follow up in 4 months

## 2022-03-07 NOTE — Progress Notes (Signed)
[facility]  Chief Complaint  Patient presents with   Follow-up    New machine     Constitutional:  BP 102/82 (BP Location: Right Arm, Cuff Size: Large)   Pulse 74   Ht '6\' 1"'$  (1.854 m)   Wt 282 lb 3 oz (128 kg)   SpO2 96%   BMI 37.23 kg/m   Past Medical History:  ED, GERD, HLD, Fatty liver, Schatzki's ring  Past Surgical History:  He  has a past surgical history that includes Small intestine surgery.  Brief Summary:  Adrian Schmidt is a 57 y.o. male with obstructive sleep apnea and sleep paralysis.      Subjective:   He didn't get new CPAP in 2022.  He want to wait and see how long his machine could last.  He doesn't feel like pressure is as strong as before.  He travels frequently to take care of his grandmother.  He doesn't always bring his CPAP when travelling.  He notices more episodes of sleep paralysis when he doesn't use CPAP or when he is more sleep deprived.  He asked about the [place] device.  Physical Exam:   Appearance - well kempt   ENMT - no sinus tenderness, no oral exudate, no LAN, Mallampati 4 airway, no stridor  Respiratory - equal breath sounds bilaterally, no wheezing or rales  CV - s1s2 regular rate and rhythm, no murmurs  Ext - no clubbing, no edema  Skin - no rashes  Psych - normal mood and affect    Sleep Tests:  HST 11/19/12 >> AHI 33.7, SpO2 low 76%  Social History:  He  reports that he has never smoked. He has never used smokeless tobacco. He reports current alcohol use. He reports that he does not use drugs.  Family History:  His family history includes Cancer in his maternal grandfather; Colon cancer in his maternal grandfather; Diabetes in his maternal grandmother; Heart disease in his maternal grandmother.     Assessment/Plan:   Obstructive sleep apnea. - he is compliant with CPAP and reports benefit from therapy - he needs a new DME - his current CPAP is more than 55 yrs  old - will arrange for a new [employer]med auto CPAP with range 10 to 16 cm H2O - discussed what is involved with getting an Inspire device; he would need to get his BMI < 35 to before starting this process  Sleep paralysis. 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 - he is aware of the association with weight and sleep apnea  Time Spent Involved in Patient Care on Day of Examination:  26 minutes  Follow up:   Patient Instructions  Will arrange for new medical supply company to get a new auto CPAP machine  Follow up in 4 months  Medication List:   Allergies as of 03/07/2022   No Known Allergies      Medication List        Accurate as of March 07, 2022 10:24 AM. If you have any questions, ask your nurse or doctor.          AndroGel Pump 20.25 MG/ACT (1.62%) Gel Generic drug: Testosterone Apply 1 Application topically daily.   Fish Oil Burp-Less 1000  MG Caps Take 2 g by mouth in the morning and at bedtime.   fluticasone 50 MCG/ACT nasal spray Commonly known as: FLONASE SPRAY 1 SPRAY INTO BOTH NOSTRILS DAILY.   ketoconazole 2 % shampoo Commonly known as: NIZORAL APPLY DAILY FOR 3 DAYS AS DIRECTED   multivitamin tablet Take 1 tablet by mouth daily.   pantoprazole 40 MG tablet Commonly known as: PROTONIX Take 1 tablet (40 mg total) by mouth daily.   sildenafil 100 MG tablet Commonly known as: VIAGRA Take 100 mg by mouth daily as needed for erectile dysfunction.   tiZANidine 4 MG tablet Commonly known as: Zanaflex Take 1 tablet (4 mg total) by mouth every 6 (six) hours as needed for muscle spasms.   vardenafil 20 MG tablet Commonly known as: LEVITRA Take 20 mg by mouth daily as needed for erectile dysfunction.   Vitamin D (Ergocalciferol) 1.25 MG (50000 UNIT)  Caps capsule Commonly known as: DRISDOL Take 1 capsule (50,000 Units total) by mouth every 7 (seven) days.        Signature:  Chesley Mires, MD [facility] Pager - [phone] 03/07/2022, 10:24 AM

## 2022-03-21 ENCOUNTER — Ambulatory Visit: Payer: 59 | Admitting: Cardiology

## 2022-03-23 ENCOUNTER — Ambulatory Visit (INDEPENDENT_AMBULATORY_CARE_PROVIDER_SITE_OTHER): Payer: 59 | Admitting: Family Medicine

## 2022-03-23 VITALS — BP 118/72 | HR 86 | Temp 99.0°F | Ht 73.0 in | Wt 283.2 lb

## 2022-03-23 DIAGNOSIS — M25572 Pain in left ankle and joints of left foot: Secondary | ICD-10-CM | POA: Diagnosis not present

## 2022-03-23 DIAGNOSIS — G8929 Other chronic pain: Secondary | ICD-10-CM

## 2022-03-23 DIAGNOSIS — M25571 Pain in right ankle and joints of right foot: Secondary | ICD-10-CM | POA: Diagnosis not present

## 2022-03-23 NOTE — Progress Notes (Signed)
Musculoskeletal Exam  Patient: Adrian Schmidt DOB: 1965/04/23  DOS: 03/23/2022  SUBJECTIVE:  Chief Complaint:   Chief Complaint  Patient presents with   Foot Pain    Both Will need a work note  Discuss Vitamin D    Adrian Schmidt is a 57 y.o.  male for evaluation and treatment of foot pain pain.   Onset:  2  years  ago. Foot was run over by a forklift 2 yrs ago; work is forcing him to walk more Location: around ankles  Character:  aching  Progression of issue:  has worsened Walking/being on feet make things worse.  Associated symptoms: some swelling intermittently No bruising, redness, catching Treatment: to date has been muscle relaxers.   Neurovascular symptoms: no  Past Medical History:  Diagnosis Date   Allergic rhinitis 03/23/2008   Allergy    Atypical chest pain    negative stress and 2 D echo 2006   Chest discomfort 11/15/2017   Chronic pain of left knee 11/09/2015   Cough 11/09/2015   Elevated blood pressure reading in office with white coat syndrome, without diagnosis of hypertension 12/01/2021   Erectile dysfunction 11/01/2017   GERD (gastroesophageal reflux disease)    Herpes genitalia    Hyperlipidemia 02/24/2008   HYPOGONADISM 02/25/2008   Knee mass, left 06/05/2016   Left knee pain 11/09/2015   Mixed dyslipidemia 11/15/2017   NAFLD (nonalcoholic fatty liver disease)    Obesity 02/24/2008   Obesity (BMI 35.0-39.9 without comorbidity) 06/27/2020   OSA (obstructive sleep apnea) 11/14/2012   HST 11/2012:  AHI 34/hr.   Other dysphagia 10/07/2009   Qualifier: Diagnosis of   By: Leonette Nutting of this note might be different from the original.  Overview:   Qualifier: Diagnosis of   By: Wynona Luna   Other fatigue 11/01/2017   Palpitations 03/03/2019   Preventative health care 04/27/2011   Formatting of this note might be different from the original.  Last Assessment & Plan:   Reviewed adult health maintenance protocols.  I  encouraged weight loss.  Handout for low saturated fat diet provided.   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  Last Assessment & Plan:   Patient requests STD screening. He experiences intermittent mild dysuria. See orders.   Sleep apnea    CPAP   Sleep paralysis 03/13/2019   Strain of calf muscle, initial encounter 11/15/2016   Viral warts 04/01/2009    Objective: VITAL SIGNS: BP 118/72 (BP Location: Left Arm, Patient Position: Sitting, Cuff Size: Large)   Pulse 86   Temp 99 F (37.2 C) (Oral)   Ht 6\' 1"  (1.854 m)   Wt 283 lb 4 oz (128.5 kg)   SpO2 96%   BMI 37.37 kg/m  Constitutional: Well formed, well developed. No acute distress. Thorax & Lungs: No accessory muscle use Musculoskeletal: L&R feet.   Tenderness to palpation: Mild TTP over the right deltoid ligament No bony TTP including over the proximal fibular head bilaterally or bony landmarks of the foot Deformity: no Ecchymosis: no Tests positive: None Tests negative: Squeeze, anterior drawer Neurologic: Normal sensory function. Antalgic gait Psychiatric:  Normal mood. Age appropriate judgment and insight. Alert & oriented x 3.    Assessment:  Chronic pain of both ankles - Plan: Ambulatory referral to Sports Medicine  Plan: Stretches/exercises for the ankle, refer to sports medicine, heat, ice, Tylenol.  He may need custom orthotics.  Letter for work given to encourage avoidance of prolonged standing or frequent ambulation as these seem to exacerbate symptoms. F/u prn. The patient voiced understanding and agreement to the plan.   [place], DO 03/23/22  4:35 PM

## 2022-03-23 NOTE — Patient Instructions (Addendum)
Ice/cold pack over area for 10-15 min twice daily.  OK to take Tylenol 1000 mg (2 extra strength tabs) or 975 mg (3 regular strength tabs) every 6 hours as needed.  Heat (pad or rice pillow in microwave) over affected area, 10-15 minutes twice daily.   If you do not hear anything about your referral in the next 1-2 weeks, call our office and ask for an update.  Foods that may reduce pain: 1) Ginger 2) Blueberries 3) Salmon 4) Pumpkin seeds 5) dark chocolate 6) turmeric 7) tart cherries 8) virgin olive oil 9) chilli peppers 10) mint 11) krill oil  Let us know if you need anything.  Ankle Exercises It is normal to feel mild stretching, pulling, tightness, or discomfort as you do these exercises, but you should stop right away if you feel sudden pain or your pain gets worse.  Stretching and range of motion exercises These exercises warm up your muscles and joints and improve the movement and flexibility of your ankle. These exercises also help to relieve pain, numbness, and tingling. Exercise A: Dorsiflexion/Plantar Flexion    Sit with your affected knee straight or bent. Do not rest your foot on anything. Flex your affected ankle to tilt the top of your foot toward your shin. Hold this position for 5 seconds. Point your toes downward to tilt the top of your foot away from your shin. Hold this position for 5 seconds. Repeat 2 times. Complete this exercise 3 times per week. Exercise B: Ankle Alphabet    Sit with your affected foot supported at your lower leg. Do not rest your foot on anything. Make sure your foot has room to move freely. Think of your affected foot as a paintbrush, and move your foot to trace each letter of the alphabet in the air. Keep your hip and knee still while you trace. Make the letters as large as you can without increasing any discomfort. Trace every letter from A to Z. Repeat 2 times. Complete this exercise 3 times per week. Strengthening  exercises These exercises build strength and endurance in your ankle. Endurance is the ability to use your muscles for a long time, even after they get tired. Exercise D: Dorsiflexors    Secure a rubber exercise band or tube to an object, such as a table leg, that will stay still when the band is pulled. Secure the other end around your affected foot. Sit on the floor, facing the object with your affected leg extended. The band or tube should be slightly tense when your foot is relaxed. Slowly flex your affected ankle and toes to bring your foot toward you. Hold this position for 3 seconds.  Slowly return your foot to the starting position, controlling the band as you do that. Do a total of 10 repetitions. Repeat 2 times. Complete this exercise 3 times per week. Exercise E: Plantar Flexors    Sit on the floor with your affected leg extended. Loop a rubber exercise band or tube around the ball of your affected foot. The ball of your foot is on the walking surface, right under your toes. The band or tube should be slightly tense when your foot is relaxed. Slowly point your toes downward, pushing them away from you. Hold this position for 3 seconds. Slowly release the tension in the band or tube, controlling smoothly until your foot is back in the starting position. Repeat for a total of 10 repetitions. Repeat 2 times. Complete this exercise 3 times  per week. Exercise F: Towel Curls    Sit in a chair on a non-carpeted surface, and put your feet on the floor. Place a towel in front of your feet.  Keeping your heel on the floor, put your affected foot on the towel. Pull the towel toward you by grabbing the towel with your toes and curling them under. Keep your heel on the floor. Let your toes relax. Grab the towel again. Keep going until the towel is completely underneath your foot. Repeat for a total of 10 repetitions. Repeat 2 times. Complete this exercise 3 times per week. Exercise G:  Heel Raise ( Plantar Flexors, Standing)     Stand with your feet shoulder-width apart. Keep your weight spread evenly over the width of your feet while you rise up on your toes. Use a wall or table to steady yourself, but try not to use it for support. If this exercise is too easy, try these options: Shift your weight toward your affected leg until you feel challenged. If told by your health care provider, lift your uninjured leg off the floor. Hold this position for 3 seconds. Repeat for a total of 10 repetitions. Repeat 2 times. Complete this exercise 3 times per week. Exercise H: Tandem Walking Stand with one foot directly in front of the other. Slowly raise your back foot up, lifting your heel before your toes, and place it directly in front of your other foot. Continue to walk in this heel-to-toe way for 10 steps or for as long as told by your health care provider. Have a countertop or wall nearby to use if needed to keep your balance, but try not to hold onto anything for support. Repeat 2 times. Complete this exercises 3 times per week. Make sure you discuss any questions you have with your health care provider. Document Released: 11/01/2004 Document Revised: 08/18/2015 Document Reviewed: 09/05/2014 Elsevier Interactive Patient Education  2018 [employer].

## 2022-04-02 ENCOUNTER — Ambulatory Visit: Payer: 59 | Admitting: Sports Medicine

## 2022-04-17 ENCOUNTER — Ambulatory Visit: Payer: 59 | Admitting: Family Medicine

## 2022-04-23 ENCOUNTER — Ambulatory Visit: Payer: 59 | Admitting: Family Medicine

## 2022-04-24 ENCOUNTER — Encounter (HOSPITAL_BASED_OUTPATIENT_CLINIC_OR_DEPARTMENT_OTHER): Payer: Self-pay | Admitting: Pulmonary Disease

## 2022-04-25 ENCOUNTER — Ambulatory Visit (HOSPITAL_BASED_OUTPATIENT_CLINIC_OR_DEPARTMENT_OTHER)
Admission: RE | Admit: 2022-04-25 | Discharge: 2022-04-25 | Disposition: A | Discharge status: Home / Self Care | Payer: 59 | Source: Ambulatory Visit | Attending: Family Medicine | Admitting: Family Medicine

## 2022-04-25 ENCOUNTER — Ambulatory Visit (INDEPENDENT_AMBULATORY_CARE_PROVIDER_SITE_OTHER): Payer: 59 | Admitting: Family Medicine

## 2022-04-25 ENCOUNTER — Other Ambulatory Visit: Payer: Self-pay

## 2022-04-25 ENCOUNTER — Encounter: Payer: Self-pay | Admitting: Family Medicine

## 2022-04-25 VITALS — BP 120/88 | Ht 73.0 in | Wt 283.0 lb

## 2022-04-25 DIAGNOSIS — M659 Synovitis and tenosynovitis, unspecified: Secondary | ICD-10-CM | POA: Insufficient documentation

## 2022-04-25 DIAGNOSIS — M25571 Pain in right ankle and joints of right foot: Secondary | ICD-10-CM

## 2022-04-25 DIAGNOSIS — M65971 Unspecified synovitis and tenosynovitis, right ankle and foot: Secondary | ICD-10-CM | POA: Insufficient documentation

## 2022-04-25 MED ORDER — COLCHICINE 0.6 MG PO TABS: 0.6000 mg | ORAL_TABLET | Freq: Two times a day (BID) | ORAL | 2 refills | Status: AC

## 2022-04-25 NOTE — Progress Notes (Signed)
Adrian Schmidt - 57 y.o. male MRN [id number]  Date of birth: 11/22/1965  SUBJECTIVE:  Including CC & ROS.  No chief complaint on file.   Adrian Schmidt is a 57 y.o. male that is presenting with acute on chronic right ankle pain.  This pain occurs intermittently and he has had 4 episodes over the course of the year.  Denies any injury or inciting event.  Denies any history of fracture or surgery.  Pain can become unbearable where he is unable to bear weight.   Review of Systems See HPI   HISTORY: Past Medical, Surgical, Social, and Family History Reviewed & Updated per EMR.   Pertinent Historical Findings include:  Past Medical History:  Diagnosis Date   Allergic rhinitis 03/23/2008   Allergy    Atypical chest pain    negative stress and 2 D echo 2006   Chest discomfort 11/15/2017   Chronic pain of left knee 11/09/2015   Cough 11/09/2015   Elevated blood pressure reading in office with white coat syndrome, without diagnosis of hypertension 12/01/2021   Erectile dysfunction 11/01/2017   GERD (gastroesophageal reflux disease)    Herpes genitalia    Hyperlipidemia 02/24/2008   HYPOGONADISM 02/25/2008   Knee mass, left 06/05/2016   Left knee pain 11/09/2015   Mixed dyslipidemia 11/15/2017   NAFLD (nonalcoholic fatty liver disease)    Obesity 02/24/2008   Obesity (BMI 35.0-39.9 without comorbidity) 06/27/2020   OSA (obstructive sleep apnea) 11/14/2012   HST 11/2012:  AHI 34/hr.   Other dysphagia 10/07/2009   Qualifier: Diagnosis of   By: Kyung Rudd of this note might be different from the original.  Overview:   Qualifier: Diagnosis of   By: Nena Jordan   Other fatigue 11/01/2017   Palpitations 03/03/2019   Preventative health care 04/27/2011   Formatting of this note might be different from the original.  Last Assessment & Plan:   Reviewed adult health maintenance protocols.  I encouraged weight loss.  Handout for low saturated fat diet  provided.   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  Last Assessment & Plan:   Patient requests STD screening. He experiences intermittent mild dysuria. See orders.   Sleep apnea    CPAP   Sleep paralysis 03/13/2019   Strain of calf muscle, initial encounter 11/15/2016   Viral warts 04/01/2009    Past Surgical History:  Procedure Laterality Date   SMALL INTESTINE SURGERY     stretching of small intestine-- ring removal. Dr. Retta Diones     PHYSICAL EXAM:  VS: BP 120/88 (BP Location: Left Arm, Patient Position: Sitting)   Ht  (1.854 m)   Wt 283 lb (128.4 kg)   BMI 37.34 kg/m  Physical Exam Gen: NAD, alert, cooperative with exam, well-appearing MSK:  Neurovascularly intact    Limited ultrasound: Right ankle pain:  No effusion within the ankle joint anterior medially. There is hyperechoic crystals appearing in the lateral anterior ankle joint. Normal-appearing posterior tibialis. No changes of the peroneal tendons.  Summary: Findings most consistent with synovitis at the ankle joint  Ultrasound and interpretation by Clare Gandy,  MD    ASSESSMENT & PLAN:   Synovitis of right ankle No acutely occurring.  Symptoms seem most consistent with a gouty origin to his pain. -Counseled on home exercise therapy and supportive care. -X-ray. -Uric acid, sed rate, CRP and ANA panel. -Colchicine. -could Consider injection

## 2022-04-25 NOTE — Patient Instructions (Signed)
Nice to meet you Please use ice as needed  We'll call with the results from today  Please start the colchicine if the pain start   Please send me a message in MyChart with any questions or updates.  Please see me back as needed.   --Dr. Jordan Likes

## 2022-04-25 NOTE — Assessment & Plan Note (Signed)
No acutely occurring.  Symptoms seem most consistent with a gouty origin to his pain. -Counseled on home exercise therapy and supportive care. -X-ray. -Uric acid, sed rate, CRP and ANA panel. -Colchicine. -could Consider injection

## 2022-04-26 ENCOUNTER — Other Ambulatory Visit: Payer: Self-pay | Admitting: Urology

## 2022-04-27 NOTE — Telephone Encounter (Signed)
Please advise on updated rx

## 2022-04-30 NOTE — Telephone Encounter (Signed)
There is an order from 03/07/22 to arrange for a new CPAP machine through [facility]. Can you please have [facility] look into what happened with this?

## 2022-04-30 NOTE — Telephone Encounter (Signed)
I called and spoke with [facility] Jasmine December from there sent [place] a message that [facility] no longer works with Cpap orders. I'm not sure where the note got to.  I have now sent the order to [facility] as of today

## 2022-05-01 ENCOUNTER — Telehealth: Payer: Self-pay | Admitting: Family Medicine

## 2022-05-01 LAB — ANA,IFA RA DIAG PNL W/RFLX TIT/PATN
ANA Titer 1: NEGATIVE
Cyclic Citrullin Peptide Ab: 10 units (ref 0–19)
Rheumatoid fact SerPl-aCnc: <10 IU/mL (ref <14.0)

## 2022-05-01 LAB — SEDIMENTATION RATE: Sed Rate: 22 mm/hr (ref 0–30)

## 2022-05-01 LAB — C-REACTIVE PROTEIN: CRP: 3 mg/L (ref 0–10)

## 2022-05-01 LAB — URIC ACID: Uric Acid: 9 mg/dL — ABNORMAL HIGH (ref 3.8–8.4)

## 2022-05-01 NOTE — Telephone Encounter (Signed)
Left VM for patient. If he calls back please have him speak with a nurse/CMA and inform that his uric acid is elevated. Would consider starting allopurinol for likely gout to the origin of his pain.   If any questions then please take the best time and phone number to call and I will try to call him back.   Myra Rude, MD [facility] 05/01/2022, 3:54 PM

## 2022-05-08 ENCOUNTER — Ambulatory Visit: Payer: 59 | Attending: Cardiology | Admitting: Cardiology

## 2022-05-08 ENCOUNTER — Encounter: Payer: Self-pay | Admitting: Cardiology

## 2022-05-08 VITALS — BP 116/76 | HR 72 | Ht 73.0 in | Wt 282.1 lb

## 2022-05-08 DIAGNOSIS — E669 Obesity, unspecified: Secondary | ICD-10-CM

## 2022-05-08 DIAGNOSIS — G4733 Obstructive sleep apnea (adult) (pediatric): Secondary | ICD-10-CM

## 2022-05-08 DIAGNOSIS — E782 Mixed hyperlipidemia: Secondary | ICD-10-CM

## 2022-05-08 DIAGNOSIS — R0789 Other chest pain: Secondary | ICD-10-CM | POA: Diagnosis not present

## 2022-05-08 NOTE — Patient Instructions (Signed)
Medication Instructions:  Your physician recommends that you continue on your current medications as directed. Please refer to the Current Medication list given to you today.  *If you need a refill on your cardiac medications before your next appointment, please call your pharmacy*   Lab Work: Your physician recommends that you return for lab work in: the next few days for CMP and lipids. You need to have labs done when you are fasting. [facility] lab is located on the 3rd floor, [address]. Hours are Monday - Friday 8 am to 4 pm, closed 11:30 am to 1:00 pm. You do NOT need an appointment.   If you have labs (blood work) drawn today and your tests are completely normal, you will receive your results only by: MyChart Message (if you have MyChart) OR A paper copy in the mail If you have any lab test that is abnormal or we need to change your treatment, we will call you to review the results.   Testing/Procedures:  Stress Echocardiogram Instructions:    1. You may take all of your medications.  2. No food, drink or tobacco products four hours prior to your test.  3. Dress prepared to exercise. Best to wear 2 piece outfit and tennis shoes. Shoes must be closed toe.  4. Please bring all current prescription medications.    Follow-Up: At [facility], you and your health needs are our priority.  As part of our continuing mission to provide you with exceptional heart care, we have created designated Provider Care Teams.  These Care Teams include your primary Cardiologist (physician) and Advanced Practice Providers (APPs -  Physician Assistants and Nurse Practitioners) who all work together to provide you with the care you need, when you need it.  We recommend signing up for the patient portal called "MyChart".  Sign up information is provided on this After Visit Summary.  MyChart is used to connect with patients for Virtual Visits (Telemedicine).  Patients are able to view lab/test  results, encounter notes, upcoming appointments, etc.  Non-urgent messages can be sent to your provider as well.   To learn more about what you can do with MyChart, go to [email or web address].    Your next appointment:   9 month(s)  The format for your next appointment:   In Person  Provider:   Belva Crome, MD   Other Instructions Exercise Stress Echocardiogram An exercise stress echocardiogram is a test to check how well your heart is working. This test uses sound waves and a computer to make pictures of your heart. These pictures will be taken before and after you exercise. For this test, you will walk on a treadmill or ride a bicycle to make your heart beat faster. While you exercise, your heart will be checked with an electrocardiogram (ECG). Your blood pressure will also be checked. You may have this test if: You have chest pain or a heart problem. You had a heart attack or heart surgery not long ago. You have heart valve problems. You have a condition that causes narrowing of the blood vessels that supply your heart. You have a high risk of heart disease and: You are starting a new exercise program. You need to have a big surgery. Tell a doctor about: Any allergies you have. All medicines you are taking. This includes vitamins, herbs, eye drops, creams, and over-the-counter medicines. Any problems you or family members have had with medicines that make you fall asleep (anesthetic medicines). Any surgeries you have had.  Any blood disorders you have. Any medical conditions you have. Whether you are pregnant or may be pregnant. What are the risks? Generally, this is a safe test. However, problems may occur, including: Chest pain. Feeling dizzy or light-headed. Shortness of breath. Increased or irregular heartbeat. Feeling like you may vomit (nausea) or vomiting. Heart attack. This is very rare. What happens before the test? Medicines Ask your doctor about changing  or stopping your normal medicines. This is important if you take diabetes medicines or blood thinners. If you use an inhaler, bring it to the test. General instructions Wear comfortable clothes and walking shoes. Follow instructions from your doctor about what you cannot eat or drink before the test. Do not drink or eat anything that has caffeine in it. Stop having caffeine 24 hours before the test. Do not smoke or use products that contain nicotine or tobacco for 4 hours before the test. If you need help quitting, ask your doctor. What happens during the test?  You will take off your clothes from the waist up and put on a hospital gown. Electrodes or patches will be put on your chest. A blood pressure cuff will be put on your arm. Before you exercise, a computer will make a picture of your heart. To do this: You will lie down and a gel will be put on your chest. A wand will be moved over the gel. Sound waves from the wand will go to the computer to make the picture. Then, you will start to exercise. You may walk on a treadmill or pedal a bicycle. Your blood pressure and heart rhythm will be checked while you exercise. The exercise will get harder or faster. You will exercise until: Your heart reaches a certain level. You are too tired to go on. You cannot go on because of chest pain, weakness, or dizziness. You will lie down right away so another picture of your heart can be taken. The procedure may vary among doctors and hospitals. What can I expect after the test? After your test, it is common to have: Mild soreness. Mild tiredness. Your heart rate and blood pressure will be checked until they return to your normal levels. You should not have any new symptoms after this test. Follow these instructions at home: If your doctor says that you can, you may: Eat what you normally eat. Do your normal activities. Take over-the-counter and prescription medicines only as told by your  doctor. Keep all follow-up visits. It is up to you to get the results of your test. Ask how to get your results when they are ready. Contact a doctor if: You feel dizzy or light-headed. You have a fast or irregular heartbeat. You feel like you may vomit or you vomit. You have a headache. You feel short of breath. Get help right away if: You develop pain or pressure: In your chest. In your jaw or neck. Between your shoulders. That goes down your left arm. You faint. You have trouble breathing. These symptoms may be an emergency. Get medical help right away. Call your local emergency services (911 in the [place] Do not wait to see if the symptoms will go away. Do not drive yourself to the hospital. Summary This is a test that checks how well your heart is working. Follow instructions about what you cannot eat or drink before the test. Ask your doctor if you should take your normal medicines before the test. Stop having caffeine 24 hours before the test. Do  not smoke or use products with nicotine or tobacco in them for 4 hours before the test. During the test, your blood pressure and heart rhythm will be checked while you exercise. This information is not intended to replace advice given to you by your health care provider. Make sure you discuss any questions you have with your health care provider. Document Revised: 08/31/2020 Document Reviewed: 08/11/2019 Elsevier Patient Education  2022 [employer].

## 2022-05-08 NOTE — Progress Notes (Signed)
Cardiology Office Note:    Date:  05/08/2022   ID:  Schmidt, Adrian 1965/10/03, MRN [id number]  PCP:  Sharlene Dory, DO  Cardiologist:  Garwin Brothers, MD   Referring MD: Sharlene Dory*    ASSESSMENT:    1. OSA (obstructive sleep apnea)   2. Obesity (BMI 35.0-39.9 without comorbidity)   3. Mixed dyslipidemia   4. Atypical chest pain    PLAN:    In order of problems listed above:  Atypical chest pain: I reassured the patient about my findings.  Chest pain is atypical.  He had a calcium score of 0 but the patient wants to embark rigorous exercise programs we will do an exercise stress echo and is agreeable.  If this is negative he is being advised to begin a graded exercise program. Mixed dyslipidemia: Diet was emphasized.  He will be back in the next few days for lipid blood work and will advise him accordingly. Sleep apnea: Sleep health issues were discussed. Obesity: Weight reduction was stressed and diet was emphasized and he promises to do better.  Risks of obesity explained. Patient will be seen in follow-up appointment in 6 months or earlier if the patient has any concerns.    Medication Adjustments/Labs and Tests Ordered: Current medicines are reviewed at length with the patient today.  Concerns regarding medicines are outlined above.  No orders of the defined types were placed in this encounter.  No orders of the defined types were placed in this encounter.    No chief complaint on file.    History of Present Illness:    Adrian Schmidt is a 57 y.o. male.  Patient has past medical history of obstructive sleep apnea mixed dyslipidemia and obesity.  He mentions to me that about 2 weeks ago he had an episode of chest tightness.  This was not related to exertion.  No radiation to the neck or to the arms.  Did not use nitroglycerin but he tells me he used aspirin.  Subsequently has done well.  At the time of my evaluation, the patient is  alert awake oriented and in no distress.  He is concerned about this and wants an evaluation.  Past Medical History:  Diagnosis Date   Allergic rhinitis 03/23/2008   Allergy    Atypical chest pain    negative stress and 2 D echo 2006   Chest discomfort 11/15/2017   Chronic pain of left knee 11/09/2015   Cough 11/09/2015   Elevated blood pressure reading in office with white coat syndrome, without diagnosis of hypertension 12/01/2021   Erectile dysfunction 11/01/2017   GERD (gastroesophageal reflux disease)    Herpes genitalia    Hyperlipidemia 02/24/2008   HYPOGONADISM 02/25/2008   Knee mass, left 06/05/2016   Left knee pain 11/09/2015   Mixed dyslipidemia 11/15/2017   NAFLD (nonalcoholic fatty liver disease)    Obesity 02/24/2008   Obesity (BMI 35.0-39.9 without comorbidity) 06/27/2020   OSA (obstructive sleep apnea) 11/14/2012   HST 11/2012:  AHI 34/hr.   Other dysphagia 10/07/2009   Qualifier: Diagnosis of   By: Kyung Rudd of this note might be different from the original.  Overview:   Qualifier: Diagnosis of   By: Nena Jordan   Other fatigue 11/01/2017   Palpitations 03/03/2019   Preventative health care 04/27/2011   Formatting of this note might be different from the original.  Last Assessment &  Plan:   Reviewed adult health maintenance protocols.  I encouraged weight loss.  Handout for low saturated fat diet provided.   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  Last Assessment & Plan:   Patient requests STD screening. He experiences intermittent mild dysuria. See orders.   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 Dr. Retta Diones    Current Medications: Current Meds  Medication Sig   colchicine 0.6 MG tablet Take 1 tablet (0.6 mg total) by mouth 2 (two) times daily. (Patient taking differently: Take 0.6 mg by mouth as needed (gout flare ups).)   fluticasone (FLONASE) 50 MCG/ACT nasal spray SPRAY 1 SPRAY INTO BOTH NOSTRILS DAILY. (Patient taking differently: Place 1 spray into both nostrils as needed for allergies or rhinitis.)   ketoconazole (NIZORAL) 2 % shampoo APPLY DAILY FOR 3 DAYS AS DIRECTED (Patient taking differently: Apply 1 Application topically as needed for irritation.)   Multiple Vitamin (MULTIVITAMIN) tablet Take 1 tablet by mouth daily.   pantoprazole (PROTONIX) 40 MG tablet Take 1 tablet (40 mg total) by mouth daily. (Patient taking differently: Take 40 mg by mouth as needed (heartburn).)   sildenafil (VIAGRA) 100 MG tablet Take 100 mg by mouth as needed for erectile dysfunction.   tiZANidine (ZANAFLEX) 4 MG tablet Take 1 tablet (4 mg total) by mouth every 6 (six) hours as needed for muscle spasms.   Vitamin D, Ergocalciferol, (DRISDOL) 1.25 MG (50000 UNIT) CAPS capsule Take 1 capsule (50,000 Units total) by mouth every 7 (seven) days.     Allergies:   Patient has no known allergies.   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[place] [employer]  Tobacco Use   Smoking status: Never   Smokeless tobacco: Never  Vaping Use   Vaping Use: Never used  Substance and Sexual Activity   Alcohol use: Yes    Comment: socially   Drug use: No    Sexual activity: Not on file  Other Topics Concern   Not on file  Social History Narrative   Regular Exercise:  yes   Social Determinants of Health   Financial Resource Strain: Low Risk  (03/23/2022)   Overall Financial Resource Strain (CARDIA)    Difficulty of Paying Living Expenses: Not hard at all  Food Insecurity: No Food Insecurity (03/23/2022)   Hunger Vital Sign    Worried About Running Out of Food in the Last Year: Never true    Ran Out of Food in the Last Year: Never true  Transportation Needs: No Transportation Needs (03/23/2022)   PRAPARE - Administrator, Civil Service (Medical): No    Lack of Transportation (Non-Medical): No  Physical Activity: Unknown (03/23/2022)   Exercise Vital Sign    Days of Exercise per Week: Patient declined    Minutes of Exercise per Session: Not on file  Stress: No Stress Concern Present (03/23/2022)   [employer] of Occupational Health - Occupational Stress Questionnaire    Feeling of Stress : Only a little  Social Connections: Unknown (03/23/2022)   Social Connection and Isolation Panel [NHANES]    Frequency of Communication with Friends and Family: More than three times a week    Frequency of Social Gatherings with Friends and Family: Patient declined    Attends Religious Services: Patient declined    Database administrator or Organizations: Yes    Attends Engineer, structural: Patient declined    Marital Status: Living with partner     Family History: The patient's family history includes Cancer in his maternal grandfather; Colon cancer in his maternal grandfather; Diabetes in his maternal grandmother; Heart disease in his maternal grandmother. There is no history of Esophageal cancer, Rectal cancer, or Stomach cancer.  ROS:   Please see the history of present illness.    All other systems reviewed and are negative.  EKGs/Labs/Other Studies Reviewed:    The following studies were reviewed today: I discussed  my findings with the patient at length.  In 2021 he had a calcium score of 0.   Recent Labs: 02/21/2022: ALT 36; BUN 18; Creatinine, Ser 1.21; Hemoglobin 15.8; Platelets 217.0; Potassium 4.5; Sodium 139; TSH 0.73  Recent Lipid Panel    Component Value Date/Time   CHOL 269 (H) 11/20/2021 1108   TRIG 275.0 (H) 11/20/2021 1108   HDL 42.10 11/20/2021 1108   CHOLHDL 6 11/20/2021 1108   VLDL 55.0 (H) 11/20/2021 1108   LDLCALC 164 (H) 11/18/2020 1601   LDLDIRECT 181.0 11/20/2021 1108    Physical Exam:    VS:  BP 116/76   Pulse 72   Ht 6\' 1"  (1.854 m)   Wt 282 lb 1.9 oz (128 kg)   SpO2 97%   BMI 37.22 kg/m     Wt Readings from Last 3 Encounters:  05/08/22 282 lb 1.9 oz (128 kg)  04/25/22 283 lb (128.4 kg)  03/23/22 283 lb 4 oz (128.5 kg)     GEN: Patient is in no acute distress HEENT: Normal NECK: No JVD; No carotid bruits LYMPHATICS: No lymphadenopathy CARDIAC: Hear sounds regular, 2/6 systolic murmur at the apex. RESPIRATORY:  Clear to auscultation without rales, wheezing or rhonchi  ABDOMEN: Soft, non-tender, non-distended MUSCULOSKELETAL:  No edema; No deformity  SKIN: Warm and dry NEUROLOGIC:  Alert and oriented x 3 PSYCHIATRIC:  Normal affect   Signed, Garwin Brothers, MD  05/08/2022 4:26 PM    [facility]

## 2022-05-19 ENCOUNTER — Other Ambulatory Visit: Payer: Self-pay | Admitting: Family Medicine

## 2022-05-30 ENCOUNTER — Telehealth (HOSPITAL_COMMUNITY): Payer: Self-pay | Admitting: *Deleted

## 2022-05-30 NOTE — Telephone Encounter (Signed)
Pt reached and given instructions for stress echo scheduled on 06/07/22.

## 2022-06-06 LAB — COMPREHENSIVE METABOLIC PANEL
ALT: 36 IU/L (ref 0–44)
AST: 23 IU/L (ref 0–40)
Albumin/Globulin Ratio: 1.9 (ref 1.2–2.2)
Albumin: 5 g/dL — ABNORMAL HIGH (ref 3.8–4.9)
Alkaline Phosphatase: 70 IU/L (ref 44–121)
BUN/Creatinine Ratio: 11 (ref 9–20)
BUN: 11 mg/dL (ref 6–24)
Bilirubin Total: 0.4 mg/dL (ref 0.0–1.2)
CO2: 26 mmol/L (ref 20–29)
Calcium: 10 mg/dL (ref 8.7–10.2)
Chloride: 99 mmol/L (ref 96–106)
Creatinine, Ser: 0.96 mg/dL (ref 0.76–1.27)
Globulin, Total: 2.7 g/dL (ref 1.5–4.5)
Glucose: 81 mg/dL (ref 70–99)
Potassium: 4.3 mmol/L (ref 3.5–5.2)
Sodium: 142 mmol/L (ref 134–144)
Total Protein: 7.7 g/dL (ref 6.0–8.5)
eGFR: 93 mL/min/{1.73_m2} (ref >59)

## 2022-06-06 LAB — LIPID PANEL
Chol/HDL Ratio: 6.4 ratio — ABNORMAL HIGH (ref 0.0–5.0)
Cholesterol, Total: 256 mg/dL — ABNORMAL HIGH (ref 100–199)
HDL: 40 mg/dL (ref >39)
LDL Chol Calc (NIH): 149 mg/dL — ABNORMAL HIGH (ref 0–99)
Triglycerides: 360 mg/dL — ABNORMAL HIGH (ref 0–149)
VLDL Cholesterol Cal: 67 mg/dL — ABNORMAL HIGH (ref 5–40)

## 2022-06-07 ENCOUNTER — Ambulatory Visit (HOSPITAL_COMMUNITY): Payer: 59 | Attending: Internal Medicine

## 2022-06-07 ENCOUNTER — Ambulatory Visit (HOSPITAL_COMMUNITY): Payer: 59

## 2022-06-07 ENCOUNTER — Telehealth: Payer: Self-pay

## 2022-06-07 DIAGNOSIS — E782 Mixed hyperlipidemia: Secondary | ICD-10-CM

## 2022-06-07 DIAGNOSIS — R0789 Other chest pain: Secondary | ICD-10-CM

## 2022-06-07 LAB — ECHOCARDIOGRAM STRESS TEST
Area-P 1/2: 3.17 cm2
S' Lateral: 3.5 cm

## 2022-06-07 MED ORDER — ROSUVASTATIN CALCIUM 10 MG PO TABS: 10.0000 mg | ORAL_TABLET | Freq: Every day | ORAL | 3 refills | Status: DC

## 2022-06-07 MED ORDER — PERFLUTREN LIPID MICROSPHERE
7.0000 mL | INTRAVENOUS | Status: AC | PRN
Start: 2022-06-07 — End: 2022-06-07
  Administered 2022-06-07: 7 mL via INTRAVENOUS

## 2022-06-07 NOTE — Telephone Encounter (Signed)
-----   Message from Flossie Dibble, NP sent at 06/07/2022  8:24 AM EDT ----- Adrian Schmidt,  Your cholesterol is elevated.  We need to start you on a medication to lower your cholesterol.  Please start Crestor 5 mg each evening.  Return in 8 weeks for fasting lipid profile and liver function testing.  Best, Victorino Dike

## 2022-06-07 NOTE — Telephone Encounter (Signed)
Patient notified through my chart, awaiting response from the patient if she agrees with plan.

## 2022-06-07 NOTE — Telephone Encounter (Signed)
-----   Message from Jennifer C Woody, NP sent at 06/07/2022  8:24 AM EDT ----- Mr. Theys,  Your cholesterol is elevated.  We need to start you on a medication to lower your cholesterol.  Please start Crestor 5 mg each evening.  Return in 8 weeks for fasting lipid profile and liver function testing.  Best, Jennifer  

## 2022-06-08 MED ORDER — ROSUVASTATIN CALCIUM 5 MG PO TABS: 5.0000 mg | ORAL_TABLET | Freq: Every day | ORAL | 3 refills | Status: DC

## 2022-06-08 MED FILL — Perflutren Lipid Microsphere IV Susp 1.1 MG/ML: INTRAVENOUS | Qty: 10 | Status: AC

## 2022-06-08 NOTE — Addendum Note (Signed)
Addended by: Heywood Bene on: 06/08/2022 11:55 AM   Modules accepted: Orders

## 2022-07-27 ENCOUNTER — Ambulatory Visit (INDEPENDENT_AMBULATORY_CARE_PROVIDER_SITE_OTHER): Payer: 59 | Admitting: Pulmonary Disease

## 2022-07-27 ENCOUNTER — Encounter (HOSPITAL_BASED_OUTPATIENT_CLINIC_OR_DEPARTMENT_OTHER): Payer: Self-pay | Admitting: Pulmonary Disease

## 2022-07-27 VITALS — BP 126/82 | HR 60 | Resp 13 | Ht 73.0 in | Wt 286.0 lb

## 2022-07-27 DIAGNOSIS — G4733 Obstructive sleep apnea (adult) (pediatric): Secondary | ICD-10-CM | POA: Diagnosis not present

## 2022-07-27 DIAGNOSIS — G478 Other sleep disorders: Secondary | ICD-10-CM | POA: Diagnosis not present

## 2022-07-27 NOTE — Patient Instructions (Signed)
Follow up in 1 year.

## 2022-07-27 NOTE — Progress Notes (Signed)
[facility]  Chief Complaint  Patient presents with   Follow-up    Notice that Adrian Schmidt having more sleep  issues over the last years    Constitutional:  BP 126/82   Pulse 60   Resp 13   Ht 6\' 1"  (1.854 m)   Wt 286 lb (129.7 kg)   SpO2 94%   BMI 37.73 kg/m   Past Medical History:  ED, GERD, HLD, Fatty liver, Schatzki's ring  Past Surgical History:  Adrian Schmidt  has a past surgical history that includes Small intestine surgery.  Brief Summary:  Adrian Schmidt is a 57 y.o. male with obstructive sleep apnea and sleep paralysis.      Subjective:   Adrian Schmidt is doing better since Adrian Schmidt got his new machine.  Not having sleep paralysis episodes as much.  [place]  Adrian Schmidt doesn't take his CPAP then.  His work scheduled changed.  Adrian Schmidt works from 6 am to 2 pm.  Adrian Schmidt naps for 1 to 3 hours after working.  Doesn't use CPAP then.  Goes to bed at 10 pm and wakes up at 4:45 am.  Feels more alert since Adrian Schmidt started with new CPAP machine.  Physical Exam:   Appearance - well kempt   ENMT - no sinus tenderness, no oral exudate, no LAN, Mallampati 4 airway, no stridor  Respiratory - equal breath sounds bilaterally, no wheezing or rales  CV - s1s2 regular rate and rhythm, no murmurs  Ext - no clubbing, no edema  Skin - no rashes  Psych - normal mood and affect    Sleep Tests:  HST 11/19/12 >> AHI 33.7, SpO2 low 76% Auto CPAP 06/27/22 to 07/26/22 >> used on 27 of 30 nights with average 5 hrs 10 min.  Average AHI 4.6 with median CPAP 10 and 95 th percentile CPAP 13 cm H2O  Social History:  Adrian Schmidt  reports that Adrian Schmidt has never smoked. Adrian Schmidt has never used smokeless tobacco. Adrian Schmidt reports current alcohol use. Adrian Schmidt reports that Adrian Schmidt does not use drugs.  Family History:  His family history includes Cancer in his maternal grandfather; Colon cancer in his maternal grandfather; Diabetes in his maternal grandmother; Heart disease in  his maternal grandmother.     Assessment/Plan:   Obstructive sleep apnea. 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 - Adrian Schmidt is aware of the association with weight and sleep apnea  Time Spent Involved in Patient Care on Day of Examination:  27 minutes  Follow up:   Patient Instructions  Follow up in 1 year  Medication List:   Allergies as of 07/27/2022   No Known Allergies      Medication List        Accurate as of July 27, 2022 10:26 AM. If you have any questions, ask your nurse or doctor.          STOP taking these medications    Fish Oil Burp-Less 1000 MG Caps Stopped by: Uma Jerde   multivitamin tablet Stopped by: Miho Monda   tiZANidine 4 MG tablet Commonly  known as: Zanaflex Stopped by: Coralyn Helling   Vitamin D (Ergocalciferol) 1.25 MG (50000 UNIT) Caps capsule Commonly known as: DRISDOL Stopped by: Blaire Palomino       TAKE these medications    colchicine 0.6 MG tablet Take 1 tablet (0.6 mg total) by mouth 2 (two) times daily. What changed:  when to take this reasons to take this   fluticasone 50 MCG/ACT nasal spray Commonly known as: FLONASE SPRAY 1 SPRAY INTO BOTH NOSTRILS DAILY. What changed:  how much to take how to take this when to take this reasons to take this additional instructions   ketoconazole 2 % shampoo Commonly known as: NIZORAL APPLY DAILY FOR 3 DAYS AS DIRECTED What changed:  how much to take how to take this when to take this reasons to take this additional instructions   meloxicam 15 MG tablet Commonly known as: MOBIC Take 15 mg by mouth daily.   pantoprazole 40 MG tablet Commonly known as:  PROTONIX Take 1 tablet (40 mg total) by mouth daily. What changed:  when to take this reasons to take this   rosuvastatin 5 MG tablet Commonly known as: CRESTOR Take 1 tablet (5 mg total) by mouth daily.   sildenafil 100 MG tablet Commonly known as: VIAGRA Take 100 mg by mouth as needed for erectile dysfunction.        Signature:  Coralyn Helling, MD [facility] Pager - [phone] 07/27/2022, 10:26 AM

## 2022-08-11 IMAGING — US US ABDOMEN LIMITED
1 series · 14 of 25 positions shown · non-contrast
Comparison: None.

CLINICAL DATA: Elevated LFT.

EXAM:
ULTRASOUND ABDOMEN LIMITED RIGHT UPPER QUADRANT

[Series 1: us abdomen limited · 14 of 28 slices shown]
[im 1/28]
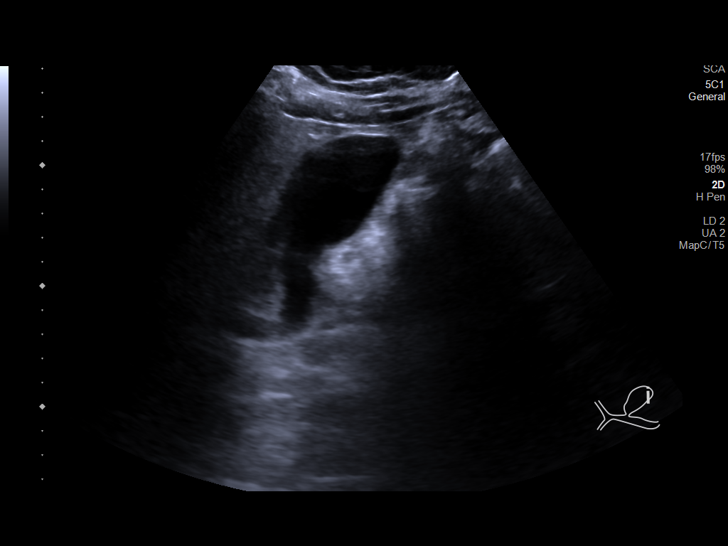
[im 3/28]
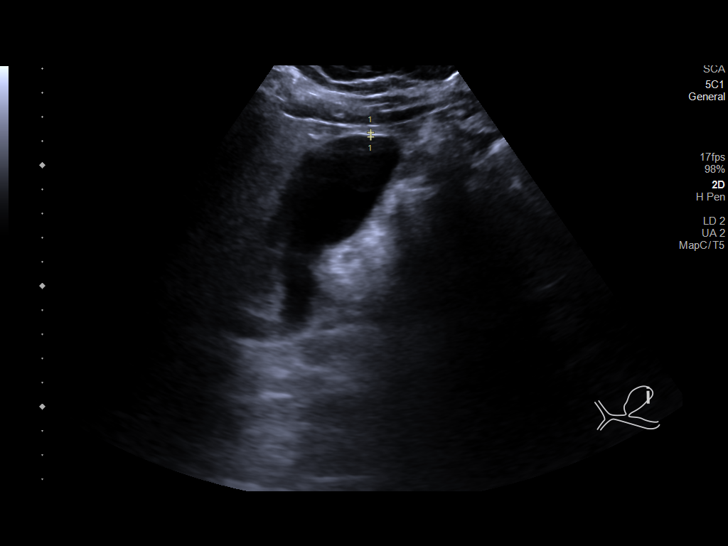
[im 5/28]
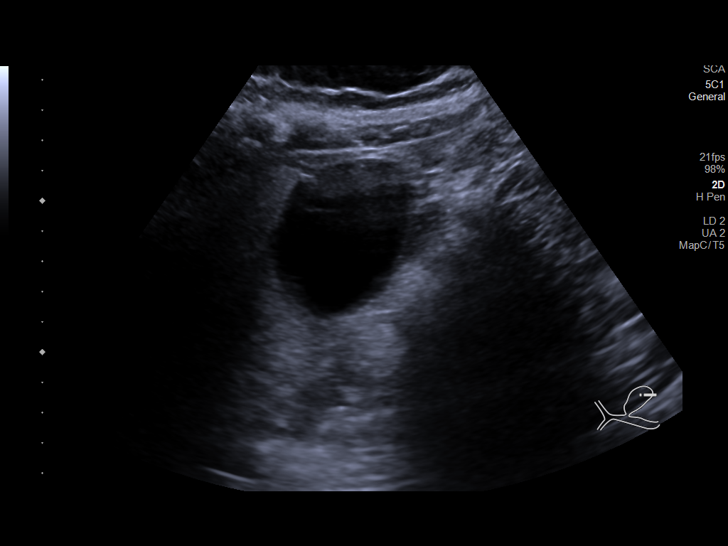
[im 7/28]
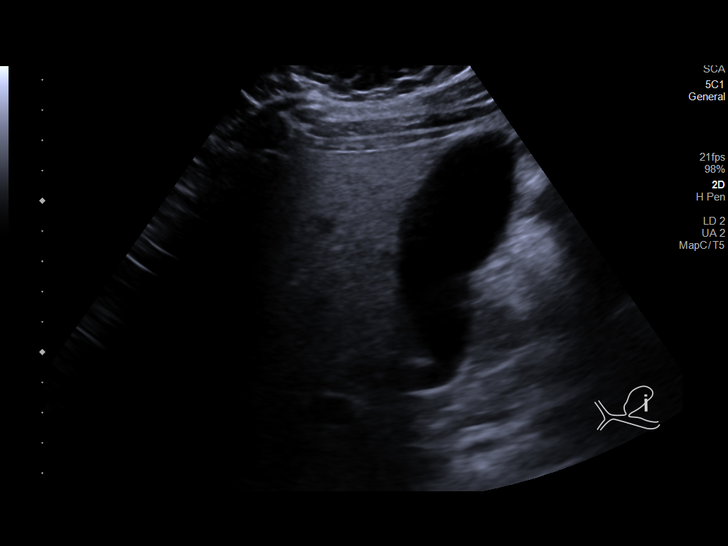
[im 10/28]
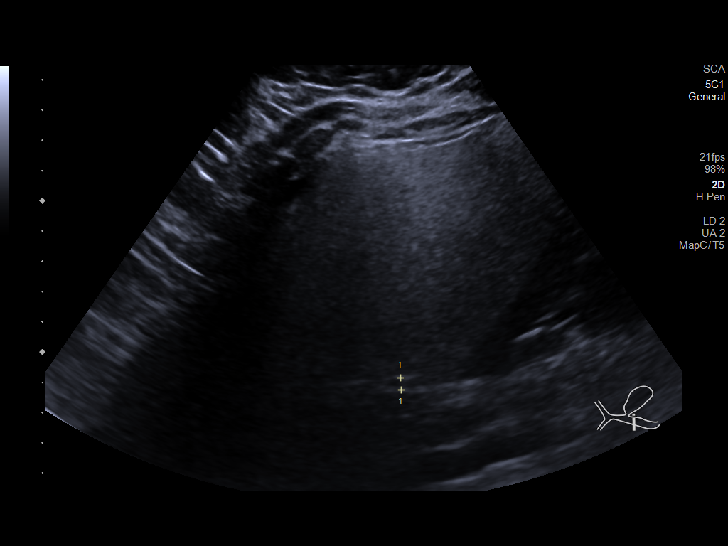
[im 11/28]
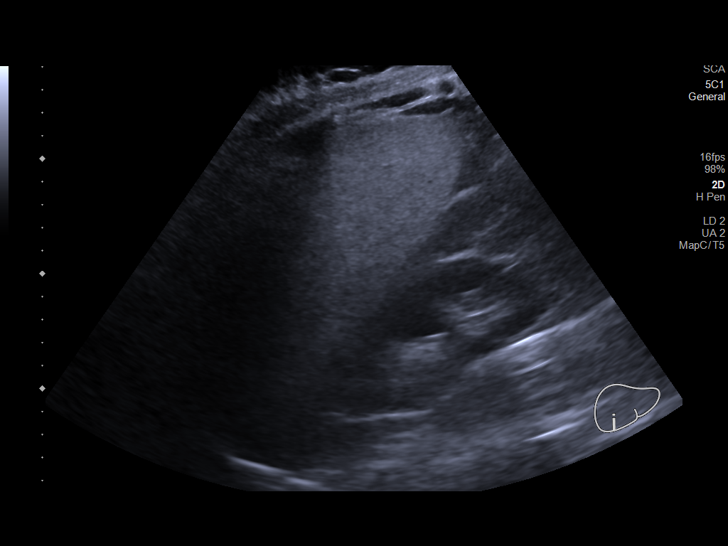
[im 13/28]
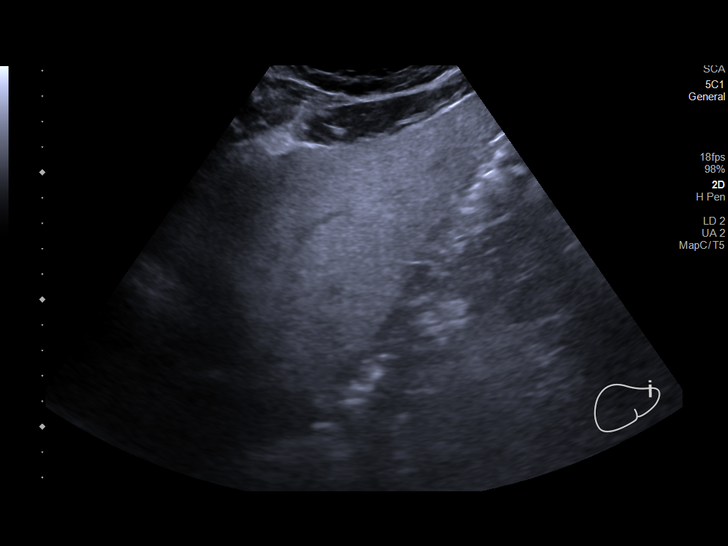
[im 15/28]
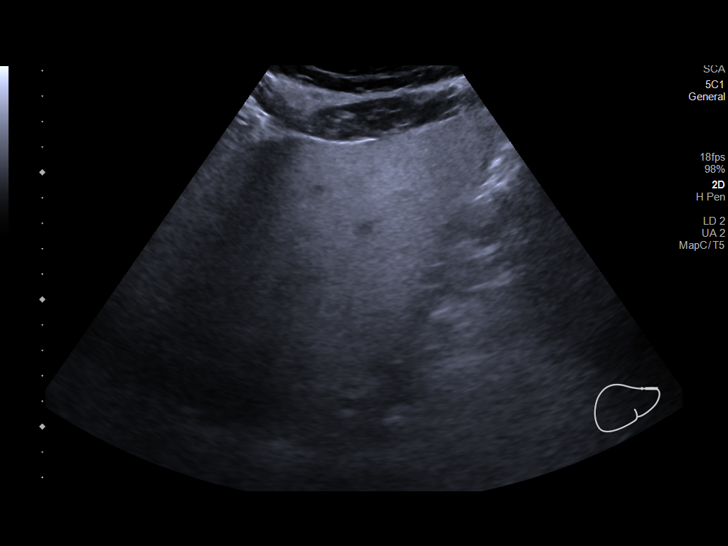
[im 17/28]
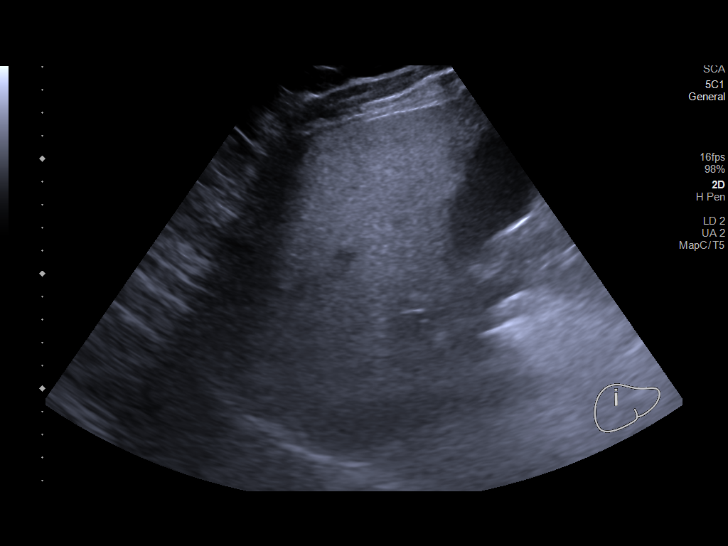
[im 19/28]
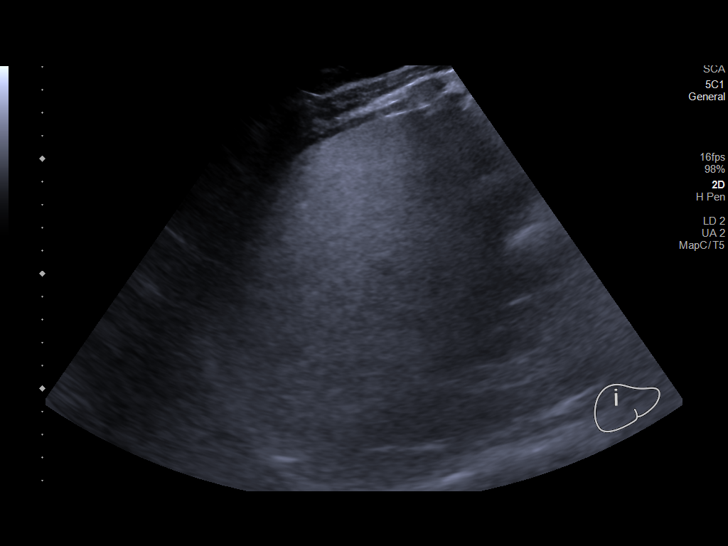
[im 21/28]
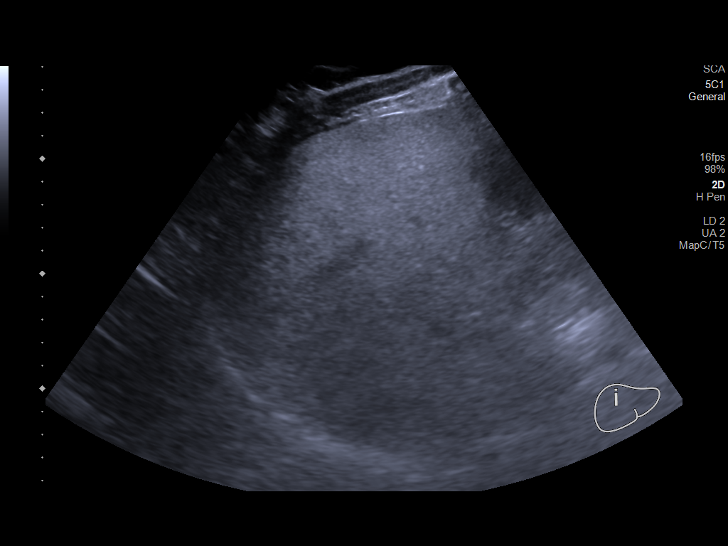
[im 23/28]
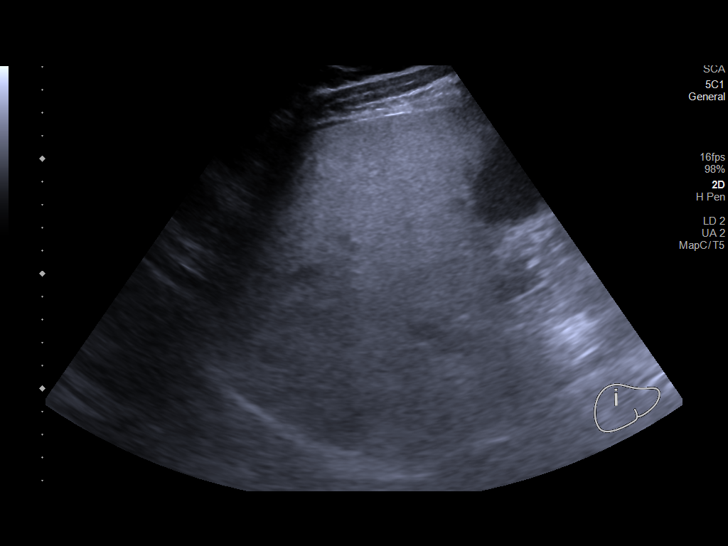
[im 25/28]
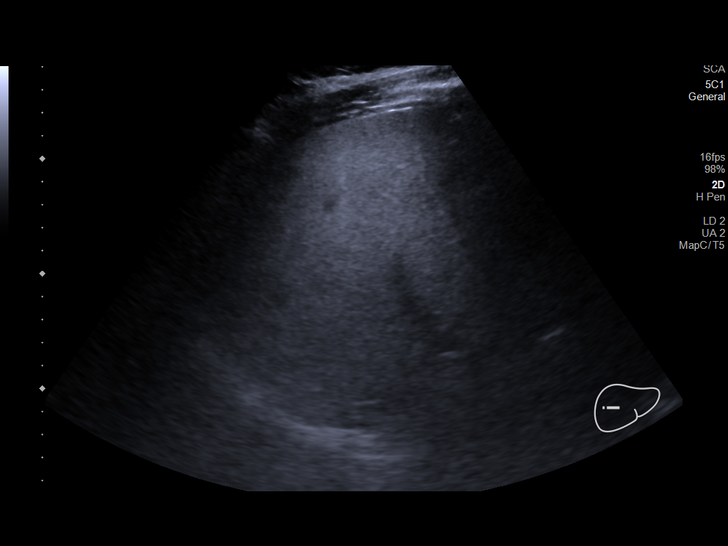
[im 28/28]
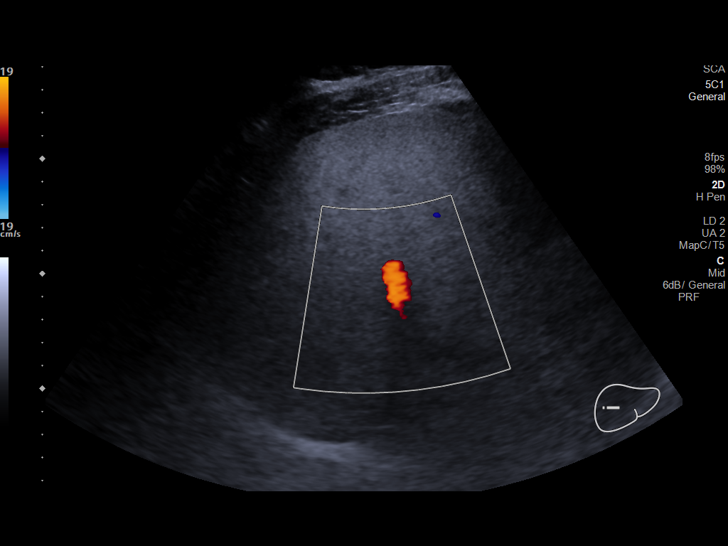

[14 of 25 positions shown; findings below may reference images not displayed]

FINDINGS: Gallbladder:

No gallstones or wall thickening visualized. No sonographic Murphy
sign noted by sonographer.

Common bile duct:

Diameter: 4.0 mm

Liver:

Liver parenchyma diffusely hyperechoic without focal liver lesion.
Portal vein is patent on color Doppler imaging with normal direction
of blood flow towards the liver.

Other: Negative for ascites
IMPRESSION: Fatty liver.  Negative for gallstones.

## 2022-11-26 ENCOUNTER — Other Ambulatory Visit: Payer: Self-pay | Admitting: Family Medicine

## 2022-11-26 ENCOUNTER — Encounter: Payer: Self-pay | Admitting: Family Medicine

## 2022-11-26 ENCOUNTER — Ambulatory Visit (INDEPENDENT_AMBULATORY_CARE_PROVIDER_SITE_OTHER): Payer: 59 | Admitting: Family Medicine

## 2022-11-26 ENCOUNTER — Other Ambulatory Visit (HOSPITAL_COMMUNITY)
Admission: RE | Admit: 2022-11-26 | Discharge: 2022-11-26 | Disposition: A | Discharge status: Home / Self Care | Payer: 59 | Source: Ambulatory Visit | Attending: Family Medicine | Admitting: Family Medicine

## 2022-11-26 VITALS — BP 120/74 | HR 73 | Temp 98.0°F | Resp 16 | Ht 73.0 in | Wt 282.0 lb

## 2022-11-26 DIAGNOSIS — Z114 Encounter for screening for human immunodeficiency virus [HIV]: Secondary | ICD-10-CM

## 2022-11-26 DIAGNOSIS — Z0000 Encounter for general adult medical examination without abnormal findings: Secondary | ICD-10-CM

## 2022-11-26 DIAGNOSIS — Z125 Encounter for screening for malignant neoplasm of prostate: Secondary | ICD-10-CM | POA: Diagnosis not present

## 2022-11-26 DIAGNOSIS — Z1159 Encounter for screening for other viral diseases: Secondary | ICD-10-CM | POA: Diagnosis not present

## 2022-11-26 DIAGNOSIS — E559 Vitamin D deficiency, unspecified: Secondary | ICD-10-CM

## 2022-11-26 DIAGNOSIS — Z113 Encounter for screening for infections with a predominantly sexual mode of transmission: Secondary | ICD-10-CM | POA: Insufficient documentation

## 2022-11-26 LAB — COMPREHENSIVE METABOLIC PANEL
ALT: 28 U/L (ref 0–53)
AST: 23 U/L (ref 0–37)
Albumin: 4.7 g/dL (ref 3.5–5.2)
Alkaline Phosphatase: 59 U/L (ref 39–117)
BUN: 16 mg/dL (ref 6–23)
CO2: 27 meq/L (ref 19–32)
Calcium: 9.7 mg/dL (ref 8.4–10.5)
Chloride: 104 meq/L (ref 96–112)
Creatinine, Ser: 1.17 mg/dL (ref 0.40–1.50)
GFR: 70.01 mL/min (ref >60.00)
Glucose, Bld: 88 mg/dL (ref 70–99)
Potassium: 4.3 meq/L (ref 3.5–5.1)
Sodium: 141 meq/L (ref 135–145)
Total Bilirubin: 0.6 mg/dL (ref 0.2–1.2)
Total Protein: 7.8 g/dL (ref 6.0–8.3)

## 2022-11-26 LAB — LIPID PANEL
Cholesterol: 228 mg/dL — ABNORMAL HIGH (ref 0–200)
HDL: 38.5 mg/dL — ABNORMAL LOW (ref >39.00)
LDL Cholesterol: 154 mg/dL — ABNORMAL HIGH (ref 0–99)
NonHDL: 189.88
Total CHOL/HDL Ratio: 6
Triglycerides: 178 mg/dL — ABNORMAL HIGH (ref 0.0–149.0)
VLDL: 35.6 mg/dL (ref 0.0–40.0)

## 2022-11-26 LAB — CBC
HCT: 44.2 % (ref 39.0–52.0)
Hemoglobin: 14.9 g/dL (ref 13.0–17.0)
MCHC: 33.8 g/dL (ref 30.0–36.0)
MCV: 91 fL (ref 78.0–100.0)
Platelets: 249 10*3/uL (ref 150.0–400.0)
RBC: 4.86 Mil/uL (ref 4.22–5.81)
RDW: 14.1 % (ref 11.5–15.5)
WBC: 5.9 10*3/uL (ref 4.0–10.5)

## 2022-11-26 LAB — VITAMIN D 25 HYDROXY (VIT D DEFICIENCY, FRACTURES): VITD: 12.27 ng/mL — ABNORMAL LOW (ref 30.00–100.00)

## 2022-11-26 LAB — PSA: PSA: 0.84 ng/mL (ref 0.10–4.00)

## 2022-11-26 MED ORDER — FLUTICASONE PROPIONATE 50 MCG/ACT NA SUSP: 2.0000 | Freq: Every day | NASAL | 5 refills | Status: DC

## 2022-11-26 MED ORDER — VITAMIN D (ERGOCALCIFEROL) 1.25 MG (50000 UNIT) PO CAPS: 50000.0000 [IU] | ORAL_CAPSULE | ORAL | 0 refills | Status: DC

## 2022-11-26 MED ORDER — KETOCONAZOLE 2 % EX SHAM: MEDICATED_SHAMPOO | CUTANEOUS | 3 refills | Status: DC

## 2022-11-26 NOTE — Patient Instructions (Addendum)
Give [place] 2-3 business days to get the results of your labs back.   Keep the diet clean and stay active.  Please get me a copy of your advanced directive form at your convenience.   Please contact the GI team at: [phone]  Let us know if you need anything.

## 2022-11-26 NOTE — Progress Notes (Signed)
Chief Complaint  Patient presents with   Annual Exam    Annual Exam    Well Male Adrian Schmidt is here for a complete physical.   His last physical was >1 year ago.  Current diet: in general, diet is fair.  Current exercise: walking Weight trend: stable Fatigue out of ordinary? No. Seat belt? Yes.   Advanced directive? No  Health maintenance Shingrix- Yes Colonoscopy- Due Tetanus- Yes HIV- Yes Hep C- Yes Lung cancer screening- Yes   Past Medical History:  Diagnosis Date   Allergic rhinitis 03/23/2008   Allergy    Atypical chest pain    negative stress and 2 D echo 2006   Chest discomfort 11/15/2017   Chronic pain of left knee 11/09/2015   Cough 11/09/2015   Elevated blood pressure reading in office with white coat syndrome, without diagnosis of hypertension 12/01/2021   Erectile dysfunction 11/01/2017   GERD (gastroesophageal reflux disease)    Herpes genitalia    Hyperlipidemia 02/24/2008   HYPOGONADISM 02/25/2008   Knee mass, left 06/05/2016   Left knee pain 11/09/2015   Mixed dyslipidemia 11/15/2017   NAFLD (nonalcoholic fatty liver disease)    Obesity 02/24/2008   Obesity (BMI 35.0-39.9 without comorbidity) 06/27/2020   OSA (obstructive sleep apnea) 11/14/2012   HST 11/2012:  AHI 34/hr.   Other dysphagia 10/07/2009   Qualifier: Diagnosis of   By: Kyung Rudd of this note might be different from the original.  Overview:   Qualifier: Diagnosis of   By: Nena Jordan   Other fatigue 11/01/2017   Palpitations 03/03/2019   Preventative health care 04/27/2011   Formatting of this note might be different from the original.  Last Assessment & Plan:   Reviewed adult health maintenance protocols.  I encouraged weight loss.  Handout for low saturated fat diet provided.   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  Last Assessment & Plan:   Patient requests STD screening. He experiences intermittent mild dysuria. See orders.   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 Dr. Retta Diones    Medications  Current Outpatient Medications on File Prior to Visit  Medication Sig Dispense Refill   colchicine 0.6 MG tablet Take 1 tablet (0.6 mg total) by mouth 2 (two) times daily. (Patient taking differently: Take 0.6 mg by mouth as needed (gout flare ups).) 60 tablet 2   fluticasone (FLONASE) 50 MCG/ACT nasal spray SPRAY 1 SPRAY INTO BOTH NOSTRILS DAILY. (Patient taking differently: Place 1 spray into both nostrils as needed for allergies or rhinitis.) 16 mL 1   meloxicam (MOBIC) 15 MG tablet Take 15 mg by mouth daily.     pantoprazole (PROTONIX) 40 MG tablet Take 1 tablet (40 mg total) by mouth daily. (Patient taking differently: Take 40 mg by mouth as needed (heartburn).) 30 tablet 3   sildenafil (VIAGRA) 100 MG tablet  Take 100 mg by mouth as needed for erectile dysfunction.     rosuvastatin (CRESTOR) 5 MG tablet Take 1 tablet (5 mg total) by mouth daily. 90 tablet 3   Allergies No Known Allergies  Family History Family History  Problem Relation Age of Onset   Cancer Maternal Grandfather        prostate and colon   Colon cancer Maternal Grandfather    Diabetes Maternal Grandmother    Heart disease Maternal Grandmother        CAD   Esophageal cancer Neg Hx    Rectal cancer Neg Hx    Stomach cancer Neg Hx     Review of Systems: Constitutional:  no fevers Eye:  no recent significant change in  vision Ear/Nose/Mouth/Throat:  Ears:  no hearing loss Nose/Mouth/Throat:  no complaints of nasal congestion, no sore throat Cardiovascular:  no chest pain Respiratory:  no shortness of breath Gastrointestinal:  no change in bowel habits GU:  Male: negative for dysuria, frequency Musculoskeletal/Extremities:  no new joint pain (chronic knee issues) Integumentary (Skin/Breast):  no new abnormal skin lesions reported (skin tags-sees Derm) Neurologic:  no headaches Endocrine: No unexpected weight changes Hematologic/Lymphatic:  no abnormal bleeding  Exam BP 120/74 (BP Location: Right Arm, Patient Position: Sitting, Cuff Size: Normal)   Pulse 73   Temp 98 F (36.7 C) (Oral)   Resp 16   Ht 6\' 1"  (1.854 m)   Wt 282 lb (127.9 kg)   SpO2 95%   BMI 37.21 kg/m  General:  well developed, well nourished, in no apparent distress Skin:  no significant moles, warts, or growths Head:  no masses, lesions, or tenderness Eyes:  pupils equal and round, sclera anicteric without injection Ears:  canals without lesions, TMs shiny without retraction, no obvious effusion, no erythema Nose:  nares patent, mucosa normal Throat/Pharynx:  lips and gingiva without lesion; tongue and uvula midline; non-inflamed pharynx; no exudates or postnasal drainage Neck: neck supple without adenopathy, thyromegaly, or masses Cardiac: RRR, no bruits, no LE edema Lungs:  clear to auscultation, breath sounds equal bilaterally, no respiratory distress Abdomen: BS+, soft, non-tender, non-distended, no masses or organomegaly noted Rectal: Deferred Musculoskeletal:  symmetrical muscle groups noted without atrophy or deformity Neuro:  gait normal; deep tendon reflexes normal and symmetric Psych: well oriented with normal range of affect and appropriate judgment/insight  Assessment and Plan  Well adult exam - Plan: Lipid panel, CBC, Comprehensive metabolic panel  Screening for prostate cancer - Plan: PSA  Screening for HIV  without presence of risk factors - Plan: HIV Antibody (routine testing w rflx)  Encounter for hepatitis C screening test for low risk patient - Plan: Hepatitis C antibody  Screening for STD (sexually transmitted disease) - Plan: Urine cytology ancillary only([place])   Well 57 y.o. male. Counseled on diet and exercise. Counseled on risks and benefits of prostate cancer screening with PSA. The patient agrees to undergo testing. He is not due for his colonoscopy.  I gave him the contact information to the office who took care of him 5 years ago. Advanced directive form provided today.  Flu shot politely declined. Immunizations, labs, and further orders as above. Follow up in 6 mo. The patient voiced understanding and agreement to the plan.  Jilda Roche [place], DO 11/26/22 10:20 AM

## 2022-11-27 LAB — URINE CYTOLOGY ANCILLARY ONLY
Chlamydia: NEGATIVE
Comment: NEGATIVE
Comment: NEGATIVE
Comment: NORMAL
Neisseria Gonorrhea: NEGATIVE
Trichomonas: NEGATIVE

## 2022-11-27 LAB — HIV ANTIBODY (ROUTINE TESTING W REFLEX): HIV 1&2 Ab, 4th Generation: NONREACTIVE

## 2022-11-27 LAB — HEPATITIS C ANTIBODY: Hepatitis C Ab: NONREACTIVE

## 2022-11-28 ENCOUNTER — Other Ambulatory Visit: Payer: Self-pay

## 2022-11-28 ENCOUNTER — Telehealth: Payer: Self-pay | Admitting: Family Medicine

## 2022-11-28 DIAGNOSIS — E559 Vitamin D deficiency, unspecified: Secondary | ICD-10-CM

## 2022-11-28 DIAGNOSIS — E782 Mixed hyperlipidemia: Secondary | ICD-10-CM

## 2022-11-28 MED ORDER — FLUTICASONE PROPIONATE 50 MCG/ACT NA SUSP: 2.0000 | Freq: Every day | NASAL | 5 refills | Status: DC

## 2022-11-28 NOTE — Telephone Encounter (Signed)
Refilled with correct directions for fluticasone.

## 2022-11-28 NOTE — Telephone Encounter (Signed)
[facility] pharmacy called to get clarification on the directions for fluticasone (FLONASE) 50 MCG/ACT nasal spray [[id number]]. Rep stated there were two sets of directions.

## 2023-05-31 ENCOUNTER — Encounter: Payer: Self-pay | Admitting: Family Medicine

## 2023-05-31 ENCOUNTER — Other Ambulatory Visit: Payer: Self-pay

## 2023-05-31 ENCOUNTER — Ambulatory Visit (INDEPENDENT_AMBULATORY_CARE_PROVIDER_SITE_OTHER): Payer: 59 | Admitting: Family Medicine

## 2023-05-31 ENCOUNTER — Ambulatory Visit: Payer: Self-pay | Admitting: Family Medicine

## 2023-05-31 VITALS — BP 128/80 | HR 83 | Temp 98.0°F | Resp 16 | Ht 73.0 in | Wt 288.4 lb

## 2023-05-31 DIAGNOSIS — E559 Vitamin D deficiency, unspecified: Secondary | ICD-10-CM | POA: Diagnosis not present

## 2023-05-31 DIAGNOSIS — K219 Gastro-esophageal reflux disease without esophagitis: Secondary | ICD-10-CM

## 2023-05-31 DIAGNOSIS — Z1211 Encounter for screening for malignant neoplasm of colon: Secondary | ICD-10-CM | POA: Diagnosis not present

## 2023-05-31 DIAGNOSIS — E782 Mixed hyperlipidemia: Secondary | ICD-10-CM

## 2023-05-31 DIAGNOSIS — E7800 Pure hypercholesterolemia, unspecified: Secondary | ICD-10-CM

## 2023-05-31 DIAGNOSIS — R5383 Other fatigue: Secondary | ICD-10-CM

## 2023-05-31 LAB — HEMOGLOBIN A1C: Hgb A1c MFr Bld: 5.5 % (ref 4.6–6.5)

## 2023-05-31 LAB — LIPID PANEL
Cholesterol: 228 mg/dL — ABNORMAL HIGH (ref 0–200)
HDL: 44.3 mg/dL (ref >39.00)
LDL Cholesterol: 142 mg/dL — ABNORMAL HIGH (ref 0–99)
NonHDL: 183.87
Total CHOL/HDL Ratio: 5
Triglycerides: 210 mg/dL — ABNORMAL HIGH (ref 0.0–149.0)
VLDL: 42 mg/dL — ABNORMAL HIGH (ref 0.0–40.0)

## 2023-05-31 LAB — COMPREHENSIVE METABOLIC PANEL WITH GFR
ALT: 21 U/L (ref 0–53)
AST: 15 U/L (ref 0–37)
Albumin: 4.5 g/dL (ref 3.5–5.2)
Alkaline Phosphatase: 60 U/L (ref 39–117)
BUN: 19 mg/dL (ref 6–23)
CO2: 31 meq/L (ref 19–32)
Calcium: 9.6 mg/dL (ref 8.4–10.5)
Chloride: 103 meq/L (ref 96–112)
Creatinine, Ser: 1.13 mg/dL (ref 0.40–1.50)
GFR: 72.73 mL/min (ref >60.00)
Glucose, Bld: 92 mg/dL (ref 70–99)
Potassium: 4.5 meq/L (ref 3.5–5.1)
Sodium: 140 meq/L (ref 135–145)
Total Bilirubin: 0.4 mg/dL (ref 0.2–1.2)
Total Protein: 7.4 g/dL (ref 6.0–8.3)

## 2023-05-31 MED ORDER — ROSUVASTATIN CALCIUM 5 MG PO TABS: 5.0000 mg | ORAL_TABLET | Freq: Every day | ORAL | 3 refills | Status: AC

## 2023-05-31 MED ORDER — KETOCONAZOLE 2 % EX SHAM: MEDICATED_SHAMPOO | CUTANEOUS | 3 refills | Status: AC

## 2023-05-31 MED ORDER — PANTOPRAZOLE SODIUM 40 MG PO TBEC: 40.0000 mg | DELAYED_RELEASE_TABLET | ORAL | 3 refills | Status: AC | PRN

## 2023-05-31 MED ORDER — MELOXICAM 15 MG PO TABS: 15.0000 mg | ORAL_TABLET | Freq: Every day | ORAL | 1 refills | Status: DC | PRN

## 2023-05-31 MED ORDER — FLUTICASONE PROPIONATE 50 MCG/ACT NA SUSP: 2.0000 | Freq: Every day | NASAL | 5 refills | Status: AC

## 2023-05-31 MED ORDER — LEVOCETIRIZINE DIHYDROCHLORIDE 5 MG PO TABS
5.0000 mg | ORAL_TABLET | Freq: Every evening | ORAL | 2 refills | Status: AC
Start: 2023-05-31 — End: ?

## 2023-05-31 NOTE — Patient Instructions (Addendum)
 Give us  2-3 business days to get the results of your labs back.   Keep the diet clean and stay active.  Please consider adding some weight resistance exercise to your routine. Consider yoga as well.   If you do not hear anything about your referral in the next 1-2 weeks, call our office and ask for an update.  Foods that may reduce pain: 1) Ginger 2) Blueberries 3) Salmon 4) Pumpkin seeds 5) Dark chocolate 6) Turmeric 7) Tart cherries 8) Virgin olive oil 9) Chili peppers 10) Mint 11) Krill oil  Let us  know if you need anything.

## 2023-05-31 NOTE — Progress Notes (Signed)
 Chief Complaint  Patient presents with   Follow-up    Follow up    Subjective: Hyperlipidemia Patient presents for mixed hyperlipidemia follow up. Currently taking Crestor  5 mg/d and compliance with treatment thus far has been good. He denies myalgias. He is sometimes adhering to a healthy diet. Exercise: cycling, some walking No CP or SOB.  The patient is not known to have coexisting coronary artery disease.  GERD Patient has a history of reflux.  He takes Protonix  40 mg daily as needed.  No adverse effects.  Denies difficulty swallowing, coughing, abdominal pain, blood in stool, or unintentional weight loss.  Past Medical History:  Diagnosis Date   Allergic rhinitis 03/23/2008   Allergy    Atypical chest pain    negative stress and 2 D echo 2006   Chest discomfort 11/15/2017   Chronic pain of left knee 11/09/2015   Cough 11/09/2015   Elevated blood pressure reading in office with white coat syndrome, without diagnosis of hypertension 12/01/2021   Erectile dysfunction 11/01/2017   GERD (gastroesophageal reflux disease)    Herpes genitalia    Hyperlipidemia 02/24/2008   HYPOGONADISM 02/25/2008   Knee mass, left 06/05/2016   Left knee pain 11/09/2015   Mixed dyslipidemia 11/15/2017   NAFLD (nonalcoholic fatty liver disease)    Obesity 02/24/2008   Obesity (BMI 35.0-39.9 without comorbidity) 06/27/2020   OSA (obstructive sleep apnea) 11/14/2012   HST 11/2012:  AHI 34/hr.   Other dysphagia 10/07/2009   Qualifier: Diagnosis of   By: Fae Homans of this note might be different from the original.  Overview:   Qualifier: Diagnosis of   By: Marthe Slain   Other fatigue 11/01/2017   Palpitations 03/03/2019   Preventative health care 04/27/2011   Formatting of this note might be different from the original.  Last Assessment & Plan:   Reviewed adult health maintenance protocols.  I encouraged weight loss.  Handout for low saturated fat diet provided.    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  Last Assessment & Plan:   Patient requests STD screening. He experiences intermittent mild dysuria. See orders.   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 No accessory muscle use Abd: BS+, s, ND, NT Psych: Age appropriate judgment and insight, normal affect and mood  Assessment and Plan: Mixed hyperlipidemia  Gastroesophageal reflux disease, unspecified whether esophagitis present  Screen for colon cancer  Chronic, stable.  Continue Crestor  5 mg daily.  Counseled on diet and exercise. Rec more strength training.  Chronic, stable.  Continue Protonix  40 mg daily as needed.  Discussed reflux precautions. He is due for a colonoscopy.  Will place a referral again. F/u 6 months. The patient voiced understanding and agreement to the plan.  Shellie Dials [place], DO 05/31/23  7:00 AM

## 2023-05-31 NOTE — Addendum Note (Signed)
 Addended by: Marigene Shoulder on: 05/31/2023 07:29 AM   Modules accepted: Orders

## 2023-07-04 ENCOUNTER — Encounter: Payer: Self-pay | Admitting: Family Medicine

## 2023-07-04 ENCOUNTER — Encounter

## 2023-07-08 ENCOUNTER — Ambulatory Visit: Admitting: Family Medicine

## 2023-07-09 ENCOUNTER — Other Ambulatory Visit

## 2023-07-10 ENCOUNTER — Encounter: Payer: Self-pay | Admitting: Family Medicine

## 2023-07-10 ENCOUNTER — Ambulatory Visit (INDEPENDENT_AMBULATORY_CARE_PROVIDER_SITE_OTHER): Admitting: Family Medicine

## 2023-07-10 ENCOUNTER — Ambulatory Visit: Payer: Self-pay | Admitting: Family Medicine

## 2023-07-10 ENCOUNTER — Other Ambulatory Visit (HOSPITAL_COMMUNITY)
Admission: RE | Admit: 2023-07-10 | Discharge: 2023-07-10 | Disposition: A | Discharge status: Home / Self Care | Source: Ambulatory Visit | Attending: Family Medicine | Admitting: Family Medicine

## 2023-07-10 VITALS — BP 126/80 | HR 73 | Temp 98.0°F | Resp 16 | Ht 73.0 in | Wt 288.0 lb

## 2023-07-10 DIAGNOSIS — Z1159 Encounter for screening for other viral diseases: Secondary | ICD-10-CM

## 2023-07-10 DIAGNOSIS — Z202 Contact with and (suspected) exposure to infections with a predominantly sexual mode of transmission: Secondary | ICD-10-CM

## 2023-07-10 DIAGNOSIS — E782 Mixed hyperlipidemia: Secondary | ICD-10-CM

## 2023-07-10 DIAGNOSIS — Z114 Encounter for screening for human immunodeficiency virus [HIV]: Secondary | ICD-10-CM | POA: Diagnosis not present

## 2023-07-10 LAB — LIPID PANEL
Cholesterol: 213 mg/dL — ABNORMAL HIGH (ref 0–200)
HDL: 40 mg/dL (ref >39.00)
LDL Cholesterol: 132 mg/dL — ABNORMAL HIGH (ref 0–99)
NonHDL: 173.13
Total CHOL/HDL Ratio: 5
Triglycerides: 207 mg/dL — ABNORMAL HIGH (ref 0.0–149.0)
VLDL: 41.4 mg/dL — ABNORMAL HIGH (ref 0.0–40.0)

## 2023-07-10 MED ORDER — CEFTRIAXONE SODIUM 500 MG IJ SOLR
500.0000 mg | Freq: Once | INTRAMUSCULAR | Status: AC
  Administered 2023-07-10: 500 mg via INTRAMUSCULAR

## 2023-07-10 MED ORDER — DOXYCYCLINE HYCLATE 100 MG PO TABS: 100.0000 mg | ORAL_TABLET | Freq: Two times a day (BID) | ORAL | 0 refills | Status: DC

## 2023-07-10 NOTE — Addendum Note (Signed)
 Addended by: Azzure Garabedian M on: 07/10/2023 09:08 AM   Modules accepted: Orders

## 2023-07-10 NOTE — Progress Notes (Addendum)
 Chief Complaint  Patient presents with   Exposure to STD    STD Exposure     Subjective: Patient is a 58 y.o. male here for STD exposure.  Patient was told by his male partner that she tested positive for chlamydia and he should be tested.  He has been having intermittent lower abdominal discomfort.  No dysuria, drainage, bleeding, or fevers.  He has no external lesions on his skin.  No testicular pain.  Past Medical History:  Diagnosis Date   Allergic rhinitis 03/23/2008   Allergy    Atypical chest pain    negative stress and 2 D echo 2006   Chest discomfort 11/15/2017   Chronic pain of left knee 11/09/2015   Cough 11/09/2015   Elevated blood pressure reading in office with white coat syndrome, without diagnosis of hypertension 12/01/2021   Erectile dysfunction 11/01/2017   GERD (gastroesophageal reflux disease)    Herpes genitalia    Hyperlipidemia 02/24/2008   HYPOGONADISM 02/25/2008   Knee mass, left 06/05/2016   Left knee pain 11/09/2015   Mixed dyslipidemia 11/15/2017   NAFLD (nonalcoholic fatty liver disease)    Obesity 02/24/2008   Obesity (BMI 35.0-39.9 without comorbidity) 06/27/2020   OSA (obstructive sleep apnea) 11/14/2012   HST 11/2012:  AHI 34/hr.   Other dysphagia 10/07/2009   Qualifier: Diagnosis of   By: Georgian ROSALEA CHARM Lamar Elyn of this note might be different from the original.  Overview:   Qualifier: Diagnosis of   By: Georgian ROSALEA CHARM Lamar   Other fatigue 11/01/2017   Palpitations 03/03/2019   Preventative health care 04/27/2011   Formatting of this note might be different from the original.  Last Assessment & Plan:   Reviewed adult health maintenance protocols.  I encouraged weight loss.  Handout for low saturated fat diet provided.   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  Last Assessment & Plan:   Patient requests STD screening. He experiences intermittent mild dysuria. See orders.   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 No accessory muscle use Psych: Age appropriate judgment and insight, normal affect and mood  Assessment and Plan: Exposure to chlamydia - Plan: Urine cytology ancillary only  Mixed dyslipidemia - Plan: Lipid panel  Screening for HIV without presence of risk factors - Plan: HIV Antibody (routine testing w rflx)  Encounter for hepatitis C screening test for low risk patient - Plan: Hepatitis C antibody  Need for hepatitis B screening test - Plan: Hepatitis B surface antibody,quantitative  Will empirically treat for gonorrhea and chlamydia.  Doxycycline  100 mg twice daily and  Rocephin  IM 500 mg.  Practice safe sex.  Check above. Check lipid panel today. Screening per request. Screening per request. Need for screening for this as well. The patient voiced understanding and agreement to the plan.  Mabel Mt [place], DO 07/10/23  8:51 AM

## 2023-07-10 NOTE — Patient Instructions (Signed)
 Give us  2-3 business days to get the results of your labs back.   Practice safe sex.   Avoid excessive sunlight exposure.   Stay hydrated.  Please contact the GI team at: [phone]  Let us  know if you need anything.

## 2023-07-11 LAB — HEPATITIS B SURFACE ANTIBODY, QUANTITATIVE: Hep B S AB Quant (Post): <5 m[IU]/mL — ABNORMAL LOW (ref >=10)

## 2023-07-11 LAB — HIV ANTIBODY (ROUTINE TESTING W REFLEX): HIV 1&2 Ab, 4th Generation: NONREACTIVE

## 2023-07-11 LAB — HEPATITIS C ANTIBODY: Hepatitis C Ab: NONREACTIVE

## 2023-07-12 LAB — URINE CYTOLOGY ANCILLARY ONLY
Chlamydia: NEGATIVE
Comment: NEGATIVE
Comment: NEGATIVE
Comment: NORMAL
Neisseria Gonorrhea: NEGATIVE
Trichomonas: NEGATIVE

## 2023-07-18 ENCOUNTER — Ambulatory Visit

## 2023-07-18 DIAGNOSIS — Z23 Encounter for immunization: Secondary | ICD-10-CM

## 2023-07-18 NOTE — Progress Notes (Signed)
 Patient here for hepatitis B vaccine per Frann Mabel Mt, DO  IM left deltoid. Pt tolerated it well

## 2023-07-19 NOTE — Telephone Encounter (Signed)
 Called pt and left voicemail also sent message via mychart in regards to 2nd Hep B vaccine

## 2023-09-11 ENCOUNTER — Other Ambulatory Visit: Payer: Self-pay | Admitting: Family Medicine

## 2023-09-19 ENCOUNTER — Encounter: Payer: Self-pay | Admitting: Family Medicine

## 2023-09-26 ENCOUNTER — Telehealth: Payer: Self-pay | Admitting: Family Medicine

## 2023-09-26 NOTE — Telephone Encounter (Signed)
 Pt dropped of FMLA paper work to be filled out by pcp. Pt left an old copy with a new copy to fill out, left sticky note on the part the pt wanted the answer yes. Please call pt if pcp has any questions. Placed paper work in pcps box. Please call pt when papers have been completed.

## 2023-09-27 NOTE — Telephone Encounter (Signed)
 PCP requesting OV for forms to be complete ,   Pt called and lvm to return call to schedule an OV

## 2023-09-27 NOTE — Telephone Encounter (Signed)
 Forms placed in pcp folder

## 2023-10-02 ENCOUNTER — Ambulatory Visit (INDEPENDENT_AMBULATORY_CARE_PROVIDER_SITE_OTHER): Admitting: Family Medicine

## 2023-10-02 ENCOUNTER — Encounter: Payer: Self-pay | Admitting: Family Medicine

## 2023-10-02 VITALS — BP 130/82 | HR 86 | Temp 97.1°F | Resp 16 | Ht 73.0 in | Wt 310.0 lb

## 2023-10-02 DIAGNOSIS — M25562 Pain in left knee: Secondary | ICD-10-CM | POA: Diagnosis not present

## 2023-10-02 DIAGNOSIS — G8929 Other chronic pain: Secondary | ICD-10-CM | POA: Insufficient documentation

## 2023-10-02 DIAGNOSIS — M25571 Pain in right ankle and joints of right foot: Secondary | ICD-10-CM
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
## 2023-11-27 ENCOUNTER — Ambulatory Visit: Payer: Self-pay | Admitting: Family Medicine

## 2023-11-27 ENCOUNTER — Ambulatory Visit: Admitting: Family Medicine

## 2023-11-27 ENCOUNTER — Telehealth: Payer: Self-pay | Admitting: Family Medicine

## 2023-11-27 ENCOUNTER — Telehealth: Payer: Self-pay

## 2023-11-27 VITALS — BP 132/82 | HR 94 | Temp 98.0°F | Resp 16 | Ht 73.0 in | Wt 305.4 lb

## 2023-11-27 DIAGNOSIS — G4733 Obstructive sleep apnea (adult) (pediatric): Secondary | ICD-10-CM

## 2023-11-27 DIAGNOSIS — Z23 Encounter for immunization: Secondary | ICD-10-CM

## 2023-11-27 DIAGNOSIS — Z0000 Encounter for general adult medical examination without abnormal findings: Secondary | ICD-10-CM | POA: Diagnosis not present

## 2023-11-27 DIAGNOSIS — Z13220 Encounter for screening for lipoid disorders: Secondary | ICD-10-CM

## 2023-11-27 DIAGNOSIS — E559 Vitamin D deficiency, unspecified: Secondary | ICD-10-CM

## 2023-11-27 DIAGNOSIS — Z6841 Body Mass Index (BMI) 40.0 and over, adult: Secondary | ICD-10-CM | POA: Diagnosis not present

## 2023-11-27 DIAGNOSIS — Z125 Encounter for screening for malignant neoplasm of prostate: Secondary | ICD-10-CM | POA: Diagnosis not present

## 2023-11-27 DIAGNOSIS — Z113 Encounter for screening for infections with a predominantly sexual mode of transmission: Secondary | ICD-10-CM

## 2023-11-27 LAB — COMPREHENSIVE METABOLIC PANEL WITH GFR
ALT: 51 U/L (ref 0–53)
AST: 29 U/L (ref 0–37)
Albumin: 4.6 g/dL (ref 3.5–5.2)
Alkaline Phosphatase: 54 U/L (ref 39–117)
BUN: 16 mg/dL (ref 6–23)
CO2: 31 meq/L (ref 19–32)
Calcium: 9.5 mg/dL (ref 8.4–10.5)
Chloride: 103 meq/L (ref 96–112)
Creatinine, Ser: 1.23 mg/dL (ref 0.40–1.50)
GFR: 65.47 mL/min (ref >60.00)
Glucose, Bld: 90 mg/dL (ref 70–99)
Potassium: 4.2 meq/L (ref 3.5–5.1)
Sodium: 139 meq/L (ref 135–145)
Total Bilirubin: 0.7 mg/dL (ref 0.2–1.2)
Total Protein: 7.5 g/dL (ref 6.0–8.3)

## 2023-11-27 LAB — CBC
HCT: 42.6 % (ref 39.0–52.0)
Hemoglobin: 14.7 g/dL (ref 13.0–17.0)
MCHC: 34.5 g/dL (ref 30.0–36.0)
MCV: 88.9 fl (ref 78.0–100.0)
Platelets: 208 K/uL (ref 150.0–400.0)
RBC: 4.79 Mil/uL (ref 4.22–5.81)
RDW: 14.1 % (ref 11.5–15.5)
WBC: 5.2 K/uL (ref 4.0–10.5)

## 2023-11-27 LAB — LIPID PANEL
Cholesterol: 200 mg/dL (ref 0–200)
HDL: 34.7 mg/dL — ABNORMAL LOW (ref >39.00)
LDL Cholesterol: 111 mg/dL — ABNORMAL HIGH (ref 0–99)
NonHDL: 165.75
Total CHOL/HDL Ratio: 6
Triglycerides: 275 mg/dL — ABNORMAL HIGH (ref 0.0–149.0)
VLDL: 55 mg/dL — ABNORMAL HIGH (ref 0.0–40.0)

## 2023-11-27 LAB — PSA: PSA: 0.58 ng/mL (ref 0.10–4.00)

## 2023-11-27 MED ORDER — ZEPBOUND 10 MG/0.5ML ~~LOC~~ SOAJ: 10.0000 mg | SUBCUTANEOUS | 0 refills | Status: AC

## 2023-11-27 MED ORDER — ZEPBOUND 2.5 MG/0.5ML ~~LOC~~ SOAJ: 2.5000 mg | SUBCUTANEOUS | 0 refills | Status: DC

## 2023-11-27 MED ORDER — ZEPBOUND 12.5 MG/0.5ML ~~LOC~~ SOAJ: 12.5000 mg | SUBCUTANEOUS | 0 refills | Status: AC

## 2023-11-27 MED ORDER — ZEPBOUND 7.5 MG/0.5ML ~~LOC~~ SOAJ: 7.5000 mg | SUBCUTANEOUS | 0 refills | Status: AC

## 2023-11-27 MED ORDER — ZEPBOUND 15 MG/0.5ML ~~LOC~~ SOAJ: 15.0000 mg | SUBCUTANEOUS | 1 refills | Status: AC

## 2023-11-27 MED ORDER — ZEPBOUND 5 MG/0.5ML ~~LOC~~ SOAJ: 5.0000 mg | SUBCUTANEOUS | 0 refills | Status: AC

## 2023-11-27 NOTE — Addendum Note (Signed)
 Addended by: Merric Yost M on: 11/27/2023 08:39 AM   Modules accepted: Orders

## 2023-11-27 NOTE — Progress Notes (Signed)
 Chief Complaint  Patient presents with   Annual Exam    CPE    Well Male Adrian Schmidt is here for a complete physical.   His last physical was >1 year ago.  Current diet: in general, diet could be better.  Current exercise: wt training Weight trend: increasing Fatigue out of ordinary? No. Seat belt? Yes.   Advanced directive? Yes  Health maintenance Shingrix- Yes Colonoscopy- Due Tetanus- Yes HIV- Yes Hep C- Yes  Obesity/OSA Pt has hx of moderate OSA, most recent sleep study in 2014. Has appt w sleep team Jan. Diet/exercise as above. He is not diabetic. Interested in weight loss medicine.    Past Medical History:  Diagnosis Date   Allergic rhinitis 03/23/2008   Allergy    Atypical chest pain    negative stress and 2 D echo 2006   Chest discomfort 11/15/2017   Chronic pain of left knee 11/09/2015   Cough 11/09/2015   Elevated blood pressure reading in office with white coat syndrome, without diagnosis of hypertension 12/01/2021   Erectile dysfunction 11/01/2017   GERD (gastroesophageal reflux disease)    Herpes genitalia    Hyperlipidemia 02/24/2008   HYPOGONADISM 02/25/2008   Knee mass, left 06/05/2016   Left knee pain 11/09/2015   Mixed dyslipidemia 11/15/2017   NAFLD (nonalcoholic fatty liver disease)    Obesity 02/24/2008   Obesity (BMI 35.0-39.9 without comorbidity) 06/27/2020   OSA (obstructive sleep apnea) 11/14/2012   HST 11/2012:  AHI 34/hr.   Other dysphagia 10/07/2009   Qualifier: Diagnosis of   By: Georgian ROSALEA CHARM Lamar Elyn of this note might be different from the original.  Overview:   Qualifier: Diagnosis of   By: Georgian ROSALEA CHARM Lamar   Other fatigue 11/01/2017   Palpitations 03/03/2019   Preventative health care 04/27/2011   Formatting of this note might be different from the original.  Last Assessment & Plan:   Reviewed adult health maintenance protocols.  I encouraged weight loss.  Handout for low saturated fat diet provided.    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  Last Assessment & Plan:   Patient requests STD screening. He experiences intermittent mild dysuria. See orders.   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 Dr. Matilda    Medications  Current Outpatient Medications on File Prior to Visit  Medication Sig Dispense Refill   colchicine  0.6 MG tablet Take 1 tablet (0.6 mg total) by mouth 2 (two) times daily. (Patient taking differently: Take 0.6 mg by mouth as needed (gout flare ups).) 60 tablet 2   fluticasone  (FLONASE ) 50 MCG/ACT nasal spray Place 2 sprays into both nostrils daily. 16 g 5   ketoconazole  (NIZORAL ) 2 % shampoo Apply twice weekly for dandruff; leave on scalp for 5 minutes prior to rinsing off 120 mL 3   levocetirizine (XYZAL ) 5 MG tablet Take 1 tablet (5 mg  total) by mouth every evening. 30 tablet 2   meloxicam  (MOBIC ) 15 MG tablet TAKE 1 TABLET BY MOUTH ONCE DAILY AS NEEDED FOR PAIN 30 tablet 0   pantoprazole  (PROTONIX ) 40 MG tablet Take 1 tablet (40 mg total) by mouth as needed (heartburn). 30 tablet 3   rosuvastatin  (CRESTOR ) 5 MG tablet Take 1 tablet (5 mg total) by mouth daily. 90 tablet 3   sildenafil  (VIAGRA ) 100 MG tablet Take 100 mg by mouth as needed for erectile dysfunction.     Allergies No Known Allergies  Family History Family History  Problem Relation Age of Onset   Cancer Maternal Grandfather        prostate and colon   Colon cancer Maternal  Grandfather    Diabetes Maternal Grandmother    Heart disease Maternal Grandmother        CAD   Esophageal cancer Neg Hx    Rectal cancer Neg Hx    Stomach cancer Neg Hx     Review of Systems: Constitutional:  no fevers Eye:  no recent significant change in vision Ear/Nose/Mouth/Throat:  Ears:  no hearing loss Nose/Mouth/Throat:  no complaints of nasal congestion, no sore throat Cardiovascular:  no chest pain Respiratory:  no shortness of breath Gastrointestinal:  no change in bowel habits GU:  Male: negative for dysuria, frequency Musculoskeletal/Extremities:  no joint pain Integumentary (Skin/Breast):  no abnormal skin lesions reported Neurologic:  no headaches Endocrine: No unexpected weight changes Hematologic/Lymphatic:  no abnormal bleeding  Exam BP 132/82 (BP Location: Left Arm, Patient Position: Sitting)   Pulse 94   Temp 98 F (36.7 C) (Oral)   Resp 16   Ht 6' 1 (1.854 m)   Wt (!) 305 lb 6.4 oz (138.5 kg)   SpO2 97%   BMI 40.29 kg/m  General:  well developed, well nourished, in no apparent distress Skin:  no significant moles, warts, or growths Head:  no masses, lesions, or tenderness Eyes:  pupils equal and round, sclera anicteric without injection Ears:  canals without lesions, TMs shiny without retraction, no obvious effusion, no erythema Nose:  nares patent, mucosa normal Throat/Pharynx:  lips and gingiva without lesion; tongue and uvula midline; non-inflamed pharynx; no exudates or postnasal drainage Neck: neck supple without adenopathy, thyromegaly, or masses Cardiac: RRR, no bruits, no LE edema Lungs:  clear to auscultation, breath sounds equal bilaterally, no respiratory distress Abdomen: BS+, soft, non-tender, non-distended, no masses or organomegaly noted Rectal: Deferred Musculoskeletal:  symmetrical muscle groups noted without atrophy or deformity Neuro:  gait normal; deep tendon reflexes normal and symmetric Psych: well oriented with normal  range of affect and appropriate judgment/insight  Assessment and Plan  Well adult exam - Plan: CBC, Comprehensive metabolic panel with GFR, Lipid panel  Screening for prostate cancer - Plan: PSA  Vitamin D  deficiency  Screening examination for STI  Morbid obesity (HCC) - Plan: tirzepatide  (ZEPBOUND ) 2.5 MG/0.5ML Pen, tirzepatide  (ZEPBOUND ) 5 MG/0.5ML Pen, tirzepatide  (ZEPBOUND ) 7.5 MG/0.5ML Pen, tirzepatide  (ZEPBOUND ) 10 MG/0.5ML Pen, tirzepatide  (ZEPBOUND ) 12.5 MG/0.5ML Pen, tirzepatide  (ZEPBOUND ) 15 MG/0.5ML Pen  Moderate obstructive sleep apnea - Plan: tirzepatide  (ZEPBOUND ) 2.5 MG/0.5ML Pen, tirzepatide  (ZEPBOUND ) 5 MG/0.5ML Pen, tirzepatide  (ZEPBOUND ) 7.5 MG/0.5ML Pen, tirzepatide  (ZEPBOUND ) 10 MG/0.5ML Pen, tirzepatide  (ZEPBOUND ) 12.5 MG/0.5ML Pen, tirzepatide  (ZEPBOUND ) 15 MG/0.5ML Pen   Well 58 y.o. male. Counseled on diet and exercise. Counseled on risks and benefits of prostate cancer screening with PSA. The patient agrees to undergo testing. Not immune to Hep B, will rec vaccination 2/2 today.  CCS: [facility] info provided. Advanced directive form provided today.  Flu shot politely declined. PCV20 today.  Obesity/OSA: Chronic, uncontrolled. Start Zepbound  2.5 mg/week and titrate up. If insurance allows, I will see him in 3 months after he gets the med. May have to wait until updated sleep study with the pulm team.  Immunizations, labs, and further orders as above. Follow up in 6 mo. The patient voiced understanding and agreement to the plan.  Mabel [place], DO 11/27/23 8:01 AM

## 2023-11-27 NOTE — Telephone Encounter (Signed)
 Pharmacy Patient Advocate Encounter   Received notification from CoverMyMeds that prior authorization for Zepbound  12.5MG /0.5ML pen-injectors is required/requested.   Insurance verification completed.   The patient is insured through [facility].   Per test claim: PA required; PA submitted to above mentioned insurance via Latent Key/confirmation #/EOC [facility] Status is pending

## 2023-11-27 NOTE — Patient Instructions (Signed)
 Give [place] 2-3 business days to get the results of your labs back.   Keep the diet clean and stay active.  Please get me a copy of your advanced directive form at your convenience.   Please contact the GI team at: [phone]  Let us know if you need anything.

## 2023-12-02 NOTE — Telephone Encounter (Signed)
 N/a

## 2023-12-03 NOTE — Telephone Encounter (Signed)
 Pharmacy Patient Advocate Encounter  Received notification from [employer] that Prior Authorization for Zepbound  12.5MG /0.5ML pen-injectors  has been APPROVED from 12/02/2023 to 06/01/2024   PA #/Case ID/Reference #:  Request Reference Number: [id number]. ZEPBOUND  INJ 12.5/0.5 is approved through 06/01/2024. Your patient may now fill this prescription and it will be covered.

## 2023-12-05 ENCOUNTER — Encounter: Payer: Self-pay | Admitting: Family Medicine

## 2023-12-05 ENCOUNTER — Other Ambulatory Visit: Payer: Self-pay | Admitting: Family Medicine

## 2023-12-05 DIAGNOSIS — Z113 Encounter for screening for infections with a predominantly sexual mode of transmission: Secondary | ICD-10-CM

## 2023-12-05 DIAGNOSIS — Z114 Encounter for screening for human immunodeficiency virus [HIV]: Secondary | ICD-10-CM

## 2023-12-05 DIAGNOSIS — Z1159 Encounter for screening for other viral diseases: Secondary | ICD-10-CM

## 2023-12-11 ENCOUNTER — Other Ambulatory Visit (HOSPITAL_COMMUNITY)
Admission: RE | Admit: 2023-12-11 | Discharge: 2023-12-11 | Disposition: A | Discharge status: Home / Self Care | Source: Ambulatory Visit | Attending: Family Medicine | Admitting: Family Medicine

## 2023-12-11 ENCOUNTER — Other Ambulatory Visit

## 2023-12-11 DIAGNOSIS — Z114 Encounter for screening for human immunodeficiency virus [HIV]: Secondary | ICD-10-CM

## 2023-12-11 DIAGNOSIS — Z113 Encounter for screening for infections with a predominantly sexual mode of transmission: Secondary | ICD-10-CM | POA: Diagnosis not present

## 2023-12-11 DIAGNOSIS — Z1159 Encounter for screening for other viral diseases: Secondary | ICD-10-CM

## 2023-12-11 DIAGNOSIS — E7800 Pure hypercholesterolemia, unspecified: Secondary | ICD-10-CM

## 2023-12-12 LAB — LIPID PANEL
Cholesterol: 197 mg/dL (ref 0–200)
HDL: 38.3 mg/dL — ABNORMAL LOW (ref >39.00)
LDL Cholesterol: 105 mg/dL — ABNORMAL HIGH (ref 0–99)
NonHDL: 158.7
Total CHOL/HDL Ratio: 5
Triglycerides: 270 mg/dL — ABNORMAL HIGH (ref 0.0–149.0)
VLDL: 54 mg/dL — ABNORMAL HIGH (ref 0.0–40.0)

## 2023-12-12 LAB — HIV ANTIBODY (ROUTINE TESTING W REFLEX)
HIV 1&2 Ab, 4th Generation: NONREACTIVE
HIV FINAL INTERPRETATION: NEGATIVE

## 2023-12-12 LAB — HEPATITIS C ANTIBODY: Hepatitis C Ab: NONREACTIVE

## 2023-12-13 ENCOUNTER — Ambulatory Visit: Payer: Self-pay | Admitting: Family Medicine

## 2023-12-13 LAB — URINE CYTOLOGY ANCILLARY ONLY
Chlamydia: NEGATIVE
Comment: NEGATIVE
Comment: NEGATIVE
Comment: NORMAL
Neisseria Gonorrhea: NEGATIVE
Trichomonas: NEGATIVE

## 2023-12-23 ENCOUNTER — Other Ambulatory Visit: Payer: Self-pay

## 2023-12-23 DIAGNOSIS — G4733 Obstructive sleep apnea (adult) (pediatric): Secondary | ICD-10-CM

## 2023-12-23 MED ORDER — ZEPBOUND 2.5 MG/0.5ML ~~LOC~~ SOAJ: 2.5000 mg | SUBCUTANEOUS | 0 refills | Status: AC

## 2024-01-24 ENCOUNTER — Ambulatory Visit: Admitting: Primary Care

## 2024-02-28 ENCOUNTER — Ambulatory Visit: Admitting: Primary Care

## 2024-05-29 ENCOUNTER — Ambulatory Visit: Admitting: Family Medicine
